# Patient Record
Sex: Male | Born: 1945 | Race: White | Hispanic: No | Marital: Married | Smoking: Former smoker
Health system: Southern US, Community
[De-identification: ages and names within clinical notes are randomized; demographics above are authoritative.]

## PROBLEM LIST (undated history)

## (undated) DIAGNOSIS — M1990 Unspecified osteoarthritis, unspecified site: Secondary | ICD-10-CM

## (undated) DIAGNOSIS — I10 Essential (primary) hypertension: Secondary | ICD-10-CM

## (undated) DIAGNOSIS — R011 Cardiac murmur, unspecified: Secondary | ICD-10-CM

## (undated) DIAGNOSIS — I5020 Unspecified systolic (congestive) heart failure: Secondary | ICD-10-CM

## (undated) DIAGNOSIS — Z87442 Personal history of urinary calculi: Secondary | ICD-10-CM

## (undated) HISTORY — PX: ANKLE FRACTURE SURGERY: SHX122

## (undated) HISTORY — PX: TONSILLECTOMY: SUR1361

## (undated) HISTORY — PX: KIDNEY SURGERY: SHX687

---

## 2017-02-07 ENCOUNTER — Encounter (HOSPITAL_COMMUNITY): Payer: Self-pay | Admitting: *Deleted

## 2017-02-07 ENCOUNTER — Emergency Department (HOSPITAL_COMMUNITY)
Admission: EM | Admit: 2017-02-07 | Discharge: 2017-02-07 | Disposition: A | Discharge status: Home / Self Care | Payer: Medicare Other | Attending: Emergency Medicine | Admitting: Emergency Medicine

## 2017-02-07 ENCOUNTER — Other Ambulatory Visit: Payer: Self-pay

## 2017-02-07 ENCOUNTER — Emergency Department (HOSPITAL_COMMUNITY): Payer: Medicare Other

## 2017-02-07 DIAGNOSIS — S82854A Nondisplaced trimalleolar fracture of right lower leg, initial encounter for closed fracture: Secondary | ICD-10-CM | POA: Insufficient documentation

## 2017-02-07 DIAGNOSIS — S99911A Unspecified injury of right ankle, initial encounter: Secondary | ICD-10-CM | POA: Diagnosis present

## 2017-02-07 DIAGNOSIS — Y93K1 Activity, walking an animal: Secondary | ICD-10-CM | POA: Insufficient documentation

## 2017-02-07 DIAGNOSIS — Y92009 Unspecified place in unspecified non-institutional (private) residence as the place of occurrence of the external cause: Secondary | ICD-10-CM | POA: Insufficient documentation

## 2017-02-07 DIAGNOSIS — S82851A Displaced trimalleolar fracture of right lower leg, initial encounter for closed fracture: Secondary | ICD-10-CM

## 2017-02-07 DIAGNOSIS — Y999 Unspecified external cause status: Secondary | ICD-10-CM | POA: Diagnosis not present

## 2017-02-07 DIAGNOSIS — W010XXA Fall on same level from slipping, tripping and stumbling without subsequent striking against object, initial encounter: Secondary | ICD-10-CM | POA: Diagnosis not present

## 2017-02-07 MED ORDER — OXYCODONE-ACETAMINOPHEN 5-325 MG PO TABS: 1.0000 | ORAL_TABLET | Freq: Four times a day (QID) | ORAL | 0 refills | Status: DC | PRN

## 2017-02-07 NOTE — Progress Notes (Signed)
Orthopedic Tech Progress Note Patient Details:  Marco Alvarado 06-09-1945 [id number]  Ortho Devices Type of Ortho Device: Post (short leg) splint, Stirrup splint Ortho Device/Splint Location: rle Ortho Device/Splint Interventions: Ordered, Application, Adjustment   Karolee Stamps 02/07/2017, 9:55 PM

## 2017-02-07 NOTE — ED Notes (Signed)
Pt refused crutches.

## 2017-02-07 NOTE — ED Provider Notes (Signed)
[facility] EMERGENCY DEPARTMENT Provider Note   CSN: [id number] Arrival date & time: 02/07/17  1747     History   Chief Complaint Chief Complaint  Patient presents with  . Ankle Pain    HPI Marco Alvarado is a 71 y.o. male who presents to the emergency department with a chief complaint of right ankle pain.  The patient reports that he was out walking his dog this afternoon, when the dog began forcefully chasing a rabbit causing the patient to fall forward.  He states that he heard a crack in his right ankle during the fall.  He reports that he was unable to ambulate or put weight on the right foot after the fall and had to crawl back to his house.  No left foot or ankle pain.  No right knee or hip pain.  No numbness or weakness.  No treatment prior to arrival.  The history is provided by the patient. No language interpreter was used.  Ankle Pain   The incident occurred 1 to 2 hours ago. The incident occurred at home. The injury mechanism was a fall. The pain is present in the right ankle. The quality of the pain is described as sharp. The pain is severe. The pain has been constant since onset. Associated symptoms include inability to bear weight. Pertinent negatives include no numbness, no muscle weakness, no loss of sensation and no tingling. He reports no foreign bodies present. The symptoms are aggravated by bearing weight. He has tried rest for the symptoms. The treatment provided mild relief.    History reviewed. No pertinent past medical history.  There are no active problems to display for this patient.   History reviewed. No pertinent surgical history.     Home Medications    Prior to Admission medications   Medication Sig Start Date End Date Taking? Authorizing Provider  oxyCODONE-acetaminophen (PERCOCET/ROXICET) 5-325 MG tablet Take 1 tablet by mouth every 6 (six) hours as needed for severe pain. 02/07/17   ,  A, PA-C    Family  History No family history on file.  Social History Social History   Tobacco Use  . Smoking status: Never Smoker  . Smokeless tobacco: Never Used  Substance Use Topics  . Alcohol use: No    Frequency: Never  . Drug use: Not on file     Allergies   Patient has no known allergies.   Review of Systems Review of Systems  Musculoskeletal: Positive for arthralgias, gait problem, joint swelling and myalgias. Negative for back pain and neck pain.  Skin: Negative for wound.  Neurological: Negative for tingling and numbness.     Physical Exam Updated Vital Signs BP (!) 157/99 (BP Location: Left Arm)   Pulse 99   Temp 97.9 F (36.6 C) (Oral)   Resp 18   Ht 5\' 7"  (1.702 m)   Wt 56.7 kg (125 lb)   SpO2 100%   BMI 19.58 kg/m   Physical Exam  Constitutional: He appears well-developed.  HENT:  Head: Normocephalic.  Eyes: Conjunctivae are normal.  Neck: Neck supple.  Cardiovascular: Normal rate and regular rhythm.  No murmur heard. Pulmonary/Chest: Effort normal.  Abdominal: Soft. He exhibits no distension.  Musculoskeletal: He exhibits edema and tenderness.  Decreased range of motion with flexion and extension of the right ankle secondary to pain.  Diffusely tender to palpation over the medial and lateral aspect of the right ankle.  Diffuse edema is noted throughout the ankle and onto the  dorsum of the right foot.  Able to move all digits of the right foot without difficulty.  DP and PT pulses are 2+.  Sensation is intact throughout the bilateral lower extremities.  Neurological: He is alert.  Skin: Skin is warm and dry.  Psychiatric: His behavior is normal.  Nursing note and vitals reviewed.    ED Treatments / Results  Labs (all labs ordered are listed, but only abnormal results are displayed) Labs Reviewed - No data to display  EKG  EKG Interpretation None       Radiology Dg Ankle Complete Right  Result Date: 02/07/2017 CLINICAL DATA:  Fall while walking  dog today. Right ankle pain and inability to bear weight. Initial encounter. EXAM: RIGHT ANKLE - COMPLETE 3+ VIEW COMPARISON:  None. FINDINGS: Minimally displaced trimalleolar ankle fracture is seen. The talus remains centered within the ankle mortise. Diffuse soft tissue swelling noted. IMPRESSION: Trimalleolar ankle fracture, without dislocation. Electronically Signed   By: Earle Gell M.D.   On: 02/07/2017 18:29    Procedures Procedures (including critical care time)  Medications Ordered in ED Medications - No data to display   Initial Impression / Assessment and Plan / ED Course  I have reviewed the triage vital signs and the nursing notes.  Pertinent labs & imaging results that were available during my care of the patient were reviewed by me and considered in my medical decision making (see chart for details).     Presenting with right ankle pain and inability to bear weight after a fall earlier today while walking his dog.  Right ankle x-ray demonstrates a trimalleolar ankle fracture that is minimally displaced.  On my read, the ankle mortise space appears well maintained.  On physical exam, the patient is neurovascularly intact. Discussed and evaluated the patient with Dr. Lita Mains, attending physician. Spoke with Dr. Alvan Dame with orthpaedic surgery who agrees with the plan to discharge the patient to home with crutches, posterior leg splint with stirrups, RICE therapy, non-weight bearing, and follow-up in the clinic in 4 days with either Dr. Doran Durand or Dr. Stann Mainland.  On reexamination after splint placement, the patient remains neurovascularly intact. Discussed this plan with the patient and his family who are in agreement at this time.  We will send the patient home with a short course of pain medication. A 46-month prescription history query was performed using the [place] CSRS prior to discharge. Strict return precautions are given.  No acute distress.  Patient is safe for discharge at this  time.  Final Clinical Impressions(s) / ED Diagnoses   Final diagnoses:  Closed trimalleolar fracture of right ankle, initial encounter    ED Discharge Orders        Ordered    oxyCODONE-acetaminophen (PERCOCET/ROXICET) 5-325 MG tablet  Every 6 hours PRN     02/07/17 2056       Joline Maxcy A, PA-C 02/07/17 2126    Julianne Rice, MD 02/17/17 [phone]

## 2017-02-07 NOTE — Discharge Instructions (Signed)
Please use the crutches and did not put any weight on your right foot or leg until you are seen by [facility] orthopedics.  Please call Monday morning to schedule a follow-up appointment for Wed., Dec. 5 with either Dr. Doran Durand or Dr. Stann Mainland.   When you are at home, please elevate the leg above the level of the heart to help with swelling.  Please cover the splint with a plastic bag when you are bathing or showering.  It is important to keep the splint clean and dry.  Do not remove the splint until you are seen by the orthopedist.  Try not to be up walking around any more than needed over the next few days.  For severe pain, you can take 1 tablet of Percocet every 6 hours.  Please note, this medication is a narcotic and can cause you to be impaired if you have to drive or work and can also be addicting.  For mild or moderate pain, please take 600 mg of ibuprofen with food or 650 mg of Tylenol every 6 hours as needed.   If you develop new or worsening symptoms, including numbness or weakness in the right foot or ankle, have a new fall or injury, or other new concerning symptoms, please return to the emergency department for reevaluation.

## 2017-02-07 NOTE — ED Triage Notes (Signed)
The pt was out walking his dog today and the dog ran after a squirrel causing the pt to fall and he heard a crack in his ankle.  Painful when ambulating

## 2021-07-10 ENCOUNTER — Ambulatory Visit
Admission: RE | Admit: 2021-07-10 | Discharge: 2021-07-10 | Disposition: A | Discharge status: Home / Self Care | Payer: Medicare Other | Source: Ambulatory Visit | Attending: Chiropractic Medicine | Admitting: Chiropractic Medicine

## 2021-07-10 ENCOUNTER — Other Ambulatory Visit: Payer: Self-pay | Admitting: Chiropractic Medicine

## 2021-07-10 DIAGNOSIS — M5442 Lumbago with sciatica, left side: Secondary | ICD-10-CM

## 2022-03-05 ENCOUNTER — Other Ambulatory Visit: Payer: Self-pay | Admitting: Urology

## 2022-03-05 DIAGNOSIS — D4102 Neoplasm of uncertain behavior of left kidney: Secondary | ICD-10-CM

## 2022-03-28 ENCOUNTER — Ambulatory Visit
Admission: RE | Admit: 2022-03-28 | Discharge: 2022-03-28 | Disposition: A | Discharge status: Home / Self Care | Payer: Medicare Other | Source: Ambulatory Visit | Attending: Urology | Admitting: Urology

## 2022-03-28 DIAGNOSIS — D4102 Neoplasm of uncertain behavior of left kidney: Secondary | ICD-10-CM

## 2022-03-28 MED ORDER — GADOPICLENOL 0.5 MMOL/ML IV SOLN: 6.0000 mL | Freq: Once | INTRAVENOUS | Status: DC | PRN

## 2022-03-28 MED ORDER — GADOPICLENOL 0.5 MMOL/ML IV SOLN
6.0000 mL | Freq: Once | INTRAVENOUS | Status: AC | PRN
  Administered 2022-03-28: 6 mL via INTRAVENOUS

## 2022-08-05 ENCOUNTER — Other Ambulatory Visit: Payer: Self-pay | Admitting: Neurological Surgery

## 2022-08-07 NOTE — Pre-Procedure Instructions (Signed)
Surgical Instructions    Your procedure is scheduled on Friday, June 7.  Report to [facility] Main Entrance "A" at 5:30 A.M., then check in with the Admitting office.  Call this number if you have problems the morning of surgery:  [phone]   If you have any questions prior to your surgery date call [phone]: Open Monday-Friday 8am-4pm If you experience any cold or flu symptoms such as cough, fever, chills, shortness of breath, etc. between now and your scheduled surgery, please notify us at the above number     Remember:  Do not eat or drink after midnight the night before your surgery   Take these medicines the morning of surgery with A SIP OF WATER:  oxyCODONE-acetaminophen (PERCOCET/ROXICET if needed  As of today, STOP taking any Aspirin (unless otherwise instructed by your surgeon) Aleve, Naproxen, Ibuprofen, Motrin, Advil, Goody's, BC's, all herbal medications, fish oil, and all vitamins.             [facility] is not responsible for any belongings or valuables.    Do NOT Smoke (Tobacco/Vaping)  24 hours prior to your procedure  If you use a CPAP at night, you may bring your mask for your overnight stay.   Contacts, glasses, hearing aids, dentures or partials may not be worn into surgery, please bring cases for these belongings   For patients admitted to the hospital, discharge time will be determined by your treatment team.   Patients discharged the day of surgery will not be allowed to drive home, and someone needs to stay with them for 24 hours.   SURGICAL WAITING ROOM VISITATION Patients having surgery or a procedure may have no more than 2 support people in the waiting area - these visitors may rotate.   Children under the age of 61 must have an adult with them who is not the patient. If the patient needs to stay at the hospital during part of their recovery, the visitor guidelines for inpatient rooms apply. Pre-op nurse will coordinate an appropriate time  for 1 support person to accompany patient in pre-op.  This support person may not rotate.   Please refer to [email or web address] for the visitor guidelines for Inpatients (after your surgery is over and you are in a regular room).    Special instructions:    Oral Hygiene is also important to reduce your risk of infection.  Remember - BRUSH YOUR TEETH THE MORNING OF SURGERY WITH YOUR REGULAR TOOTHPASTE    Pre-operative 5 CHG Bath Instructions   You can play a key role in reducing the risk of infection after surgery. Your skin needs to be as free of germs as possible. You can reduce the number of germs on your skin by washing with CHG (chlorhexidine gluconate) soap before surgery. CHG is an antiseptic soap that kills germs and continues to kill germs even after washing.   DO NOT use if you have an allergy to chlorhexidine/CHG or antibacterial soaps. If your skin becomes reddened or irritated, stop using the CHG and notify one of our RNs at [phone].   Please shower with the CHG soap starting 4 days before surgery using the following schedule:     Please keep in mind the following:  DO NOT shave, including legs and underarms, starting the day of your first shower.   You may shave your face at any point before/day of surgery.  Place clean sheets on your bed the day you start using CHG soap. Use a clean  washcloth (not used since being washed) for each shower. DO NOT sleep with pets once you start using the CHG.   CHG Shower Instructions:  If you choose to wash your hair and private area, wash first with your normal shampoo/soap.  After you use shampoo/soap, rinse your hair and body thoroughly to remove shampoo/soap residue.  Turn the water OFF and apply about 3 tablespoons (45 ml) of CHG soap to a CLEAN washcloth.  Apply CHG soap ONLY FROM YOUR NECK DOWN TO YOUR TOES (washing for 3-5 minutes)  DO NOT use CHG soap on face, private areas,  open wounds, or sores.  Pay special attention to the area where your surgery is being performed.  If you are having back surgery, having someone wash your back for you may be helpful. Wait 2 minutes after CHG soap is applied, then you may rinse off the CHG soap.  Pat dry with a clean towel  Put on clean clothes/pajamas   If you choose to wear lotion, please use ONLY the CHG-compatible lotions on the back of this paper.     Additional instructions for the day of surgery: DO NOT APPLY any lotions, deodorants, cologne, or perfumes.   Put on clean/comfortable clothes.  Brush your teeth.  Ask your nurse before applying any prescription medications to the skin.      CHG Compatible Lotions   Aveeno Moisturizing lotion  Cetaphil Moisturizing Cream  Cetaphil Moisturizing Lotion  Clairol Herbal Essence Moisturizing Lotion, Dry Skin  Clairol Herbal Essence Moisturizing Lotion, Extra Dry Skin  Clairol Herbal Essence Moisturizing Lotion, Normal Skin  Curel Age Defying Therapeutic Moisturizing Lotion with Alpha Hydroxy  Curel Extreme Care Body Lotion  Curel Soothing Hands Moisturizing Hand Lotion  Curel Therapeutic Moisturizing Cream, Fragrance-Free  Curel Therapeutic Moisturizing Lotion, Fragrance-Free  Curel Therapeutic Moisturizing Lotion, Original Formula  Eucerin Daily Replenishing Lotion  Eucerin Dry Skin Therapy Plus Alpha Hydroxy Crme  Eucerin Dry Skin Therapy Plus Alpha Hydroxy Lotion  Eucerin Original Crme  Eucerin Original Lotion  Eucerin Plus Crme Eucerin Plus Lotion  Eucerin TriLipid Replenishing Lotion  Keri Anti-Bacterial Hand Lotion  Keri Deep Conditioning Original Lotion Dry Skin Formula Softly Scented  Keri Deep Conditioning Original Lotion, Fragrance Free Sensitive Skin Formula  Keri Lotion Fast Absorbing Fragrance Free Sensitive Skin Formula  Keri Lotion Fast Absorbing Softly Scented Dry Skin Formula  Keri Original Lotion  Keri Skin Renewal Lotion Keri Silky  Smooth Lotion  Keri Silky Smooth Sensitive Skin Lotion  Nivea Body Creamy Conditioning Oil  Nivea Body Extra Enriched Lotion  Nivea Body Original Lotion  Nivea Body Sheer Moisturizing Lotion Nivea Crme  Nivea Skin Firming Lotion  NutraDerm 30 Skin Lotion  NutraDerm Skin Lotion  NutraDerm Therapeutic Skin Cream  NutraDerm Therapeutic Skin Lotion  ProShield Protective Hand Cream  Provon moisturizing lotion  Day of Surgery:  Take a shower with CHG soap. Wear Clean/Comfortable clothing the morning of surgery Do not wear jewelry or makeup. Do not wear lotions, powders, perfumes/cologne or deodorant.  Men may shave face and neck. Do not bring valuables to the hospital. Do not wear nail polish, gel polish, artificial nails, or any other type of covering on natural nails (fingers and toes) If you have artificial nails or gel coating that need to be removed by a nail salon, please have this removed prior to surgery. Artificial nails or gel coating may interfere with anesthesia's ability to adequately monitor your vital signs. Remember to brush your teeth WITH YOUR REGULAR TOOTHPASTE.  If you received a COVID test during your pre-op visit, it is requested that you wear a mask when out in public, stay away from anyone that may not be feeling well, and notify your surgeon if you develop symptoms. If you have been in contact with anyone that has tested positive in the last 10 days, please notify your surgeon.    Please read over the following fact sheets that you were given.

## 2022-08-08 ENCOUNTER — Other Ambulatory Visit: Payer: Self-pay

## 2022-08-08 ENCOUNTER — Encounter (HOSPITAL_COMMUNITY): Payer: Self-pay

## 2022-08-08 ENCOUNTER — Encounter (HOSPITAL_COMMUNITY)
Admission: RE | Admit: 2022-08-08 | Discharge: 2022-08-08 | Disposition: A | Discharge status: Home / Self Care | Payer: Medicare Other | Source: Ambulatory Visit | Attending: Neurological Surgery | Admitting: Neurological Surgery

## 2022-08-08 VITALS — BP 175/107 | HR 92 | Temp 97.6°F | Resp 17 | Ht 67.0 in | Wt 130.0 lb

## 2022-08-08 DIAGNOSIS — I10 Essential (primary) hypertension: Secondary | ICD-10-CM | POA: Diagnosis not present

## 2022-08-08 DIAGNOSIS — Z01818 Encounter for other preprocedural examination: Secondary | ICD-10-CM | POA: Insufficient documentation

## 2022-08-08 DIAGNOSIS — I2510 Atherosclerotic heart disease of native coronary artery without angina pectoris: Secondary | ICD-10-CM | POA: Diagnosis not present

## 2022-08-08 HISTORY — DX: Unspecified systolic (congestive) heart failure: I50.20

## 2022-08-08 HISTORY — DX: Essential (primary) hypertension: I10

## 2022-08-08 HISTORY — DX: Personal history of urinary calculi: Z87.442

## 2022-08-08 LAB — TYPE AND SCREEN
ABO/RH(D): O POS
Antibody Screen: NEGATIVE

## 2022-08-08 LAB — BASIC METABOLIC PANEL
Anion gap: 10 (ref 5–15)
BUN: 20 mg/dL (ref 8–23)
CO2: 24 mmol/L (ref 22–32)
Calcium: 9.2 mg/dL (ref 8.9–10.3)
Chloride: 99 mmol/L (ref 98–111)
Creatinine, Ser: 1.12 mg/dL (ref 0.61–1.24)
GFR, Estimated: >60 mL/min (ref >60)
Glucose, Bld: 103 mg/dL — ABNORMAL HIGH (ref 70–99)
Potassium: 3.5 mmol/L (ref 3.5–5.1)
Sodium: 133 mmol/L — ABNORMAL LOW (ref 135–145)

## 2022-08-08 LAB — CBC
HCT: 50.4 % (ref 39.0–52.0)
Hemoglobin: 16.8 g/dL (ref 13.0–17.0)
MCH: 30.3 pg (ref 26.0–34.0)
MCHC: 33.3 g/dL (ref 30.0–36.0)
MCV: 91 fL (ref 80.0–100.0)
Platelets: 262 10*3/uL (ref 150–400)
RBC: 5.54 MIL/uL (ref 4.22–5.81)
RDW: 13.2 % (ref 11.5–15.5)
WBC: 8.4 10*3/uL (ref 4.0–10.5)
nRBC: 0 % (ref 0.0–0.2)

## 2022-08-08 LAB — PROTIME-INR
INR: 0.9 (ref 0.8–1.2)
Prothrombin Time: 12.8 seconds (ref 11.4–15.2)

## 2022-08-08 LAB — SURGICAL PCR SCREEN
MRSA, PCR: NEGATIVE
Staphylococcus aureus: NEGATIVE

## 2022-08-08 NOTE — Pre-Procedure Instructions (Addendum)
Surgical Instructions    Your procedure is scheduled on Friday, August 15, 2022 at 10:22 AM.  Report to [facility] Main Entrance "A" at 8:20 A.M., then check in with the Admitting office.  Call this number if you have problems the morning of surgery:  [phone]   If you have any questions prior to your surgery date call [phone]: Open Monday-Friday 8am-4pm If you experience any cold or flu symptoms such as cough, fever, chills, shortness of breath, etc. between now and your scheduled surgery, please notify us at the above number     Remember:  Do not eat or drink after midnight the night before your surgery   Take these medicines the morning of surgery with A SIP OF WATER:   NONE  As of today, STOP taking any Aspirin (unless otherwise instructed by your surgeon) Aleve, Naproxen, Ibuprofen, Motrin, Advil, Goody's, BC's, all herbal medications, fish oil, and all vitamins.   [place] is not responsible for any belongings or valuables.    Do NOT Smoke (Tobacco/Vaping)  24 hours prior to your procedure  If you use a CPAP at night, you may bring your mask for your overnight stay.   Contacts, glasses, hearing aids, dentures or partials may not be worn into surgery, please bring cases for these belongings   For patients admitted to the hospital, discharge time will be determined by your treatment team.   Patients discharged the day of surgery will not be allowed to drive home, and someone needs to stay with them for 24 hours.   SURGICAL WAITING ROOM VISITATION Patients having surgery or a procedure may have no more than 2 support people in the waiting area - these visitors may rotate.   Children under the age of 59 must have an adult with them who is not the patient. If the patient needs to stay at the hospital during part of their recovery, the visitor guidelines for inpatient rooms apply. Pre-op nurse will coordinate an appropriate time for 1 support person to accompany  patient in pre-op.  This support person may not rotate.   Please refer to [email or web address] for the visitor guidelines for Inpatients (after your surgery is over and you are in a regular room).    Special instructions:    Oral Hygiene is also important to reduce your risk of infection.  Remember - BRUSH YOUR TEETH THE MORNING OF SURGERY WITH YOUR REGULAR TOOTHPASTE    Pre-operative 5 CHG Bath Instructions   You can play a key role in reducing the risk of infection after surgery. Your skin needs to be as free of germs as possible. You can reduce the number of germs on your skin by washing with CHG (chlorhexidine gluconate) soap before surgery. CHG is an antiseptic soap that kills germs and continues to kill germs even after washing.   DO NOT use if you have an allergy to chlorhexidine/CHG or antibacterial soaps. If your skin becomes reddened or irritated, stop using the CHG and notify one of our RNs at [phone].   Please shower with the CHG soap starting 4 days before surgery using the following schedule:     Please keep in mind the following:  DO NOT shave, including legs and underarms, starting the day of your first shower.   You may shave your face at any point before/day of surgery.  Place clean sheets on your bed the day you start using CHG soap. Use a clean washcloth (not used since being washed) for each  shower. DO NOT sleep with pets once you start using the CHG.   CHG Shower Instructions:  If you choose to wash your hair and private area, wash first with your normal shampoo/soap.  After you use shampoo/soap, rinse your hair and body thoroughly to remove shampoo/soap residue.  Turn the water OFF and apply about 3 tablespoons (45 ml) of CHG soap to a CLEAN washcloth.  Apply CHG soap ONLY FROM YOUR NECK DOWN TO YOUR TOES (washing for 3-5 minutes)  DO NOT use CHG soap on face, private areas, open wounds, or sores.  Pay  special attention to the area where your surgery is being performed.  If you are having back surgery, having someone wash your back for you may be helpful. Wait 2 minutes after CHG soap is applied, then you may rinse off the CHG soap.  Pat dry with a clean towel  Put on clean clothes/pajamas   If you choose to wear lotion, please use ONLY the CHG-compatible lotions on the back of this paper.     Additional instructions for the day of surgery: DO NOT APPLY any lotions, deodorants, cologne, or perfumes.   Put on clean/comfortable clothes.  Brush your teeth.  Ask your nurse before applying any prescription medications to the skin.      CHG Compatible Lotions   Aveeno Moisturizing lotion  Cetaphil Moisturizing Cream  Cetaphil Moisturizing Lotion  Clairol Herbal Essence Moisturizing Lotion, Dry Skin  Clairol Herbal Essence Moisturizing Lotion, Extra Dry Skin  Clairol Herbal Essence Moisturizing Lotion, Normal Skin  Curel Age Defying Therapeutic Moisturizing Lotion with Alpha Hydroxy  Curel Extreme Care Body Lotion  Curel Soothing Hands Moisturizing Hand Lotion  Curel Therapeutic Moisturizing Cream, Fragrance-Free  Curel Therapeutic Moisturizing Lotion, Fragrance-Free  Curel Therapeutic Moisturizing Lotion, Original Formula  Eucerin Daily Replenishing Lotion  Eucerin Dry Skin Therapy Plus Alpha Hydroxy Crme  Eucerin Dry Skin Therapy Plus Alpha Hydroxy Lotion  Eucerin Original Crme  Eucerin Original Lotion  Eucerin Plus Crme Eucerin Plus Lotion  Eucerin TriLipid Replenishing Lotion  Keri Anti-Bacterial Hand Lotion  Keri Deep Conditioning Original Lotion Dry Skin Formula Softly Scented  Keri Deep Conditioning Original Lotion, Fragrance Free Sensitive Skin Formula  Keri Lotion Fast Absorbing Fragrance Free Sensitive Skin Formula  Keri Lotion Fast Absorbing Softly Scented Dry Skin Formula  Keri Original Lotion  Keri Skin Renewal Lotion Keri Silky Smooth Lotion  Keri Silky Smooth  Sensitive Skin Lotion  Nivea Body Creamy Conditioning Oil  Nivea Body Extra Enriched Lotion  Nivea Body Original Lotion  Nivea Body Sheer Moisturizing Lotion Nivea Crme  Nivea Skin Firming Lotion  NutraDerm 30 Skin Lotion  NutraDerm Skin Lotion  NutraDerm Therapeutic Skin Cream  NutraDerm Therapeutic Skin Lotion  ProShield Protective Hand Cream  Provon moisturizing lotion  Day of Surgery:  Take a shower with CHG soap. Wear Clean/Comfortable clothing the morning of surgery Do not wear jewelry or makeup. Do not wear lotions, powders, perfumes/cologne or deodorant.  Men may shave face and neck. Do not bring valuables to the hospital. Do not wear nail polish, gel polish, artificial nails, or any other type of covering on natural nails (fingers and toes) If you have artificial nails or gel coating that need to be removed by a nail salon, please have this removed prior to surgery. Artificial nails or gel coating may interfere with anesthesia's ability to adequately monitor your vital signs. Remember to brush your teeth WITH YOUR REGULAR TOOTHPASTE.    If you received a COVID  test during your pre-op visit, it is requested that you wear a mask when out in public, stay away from anyone that may not be feeling well, and notify your surgeon if you develop symptoms. If you have been in contact with anyone that has tested positive in the last 10 days, please notify your surgeon.    Please read over the following fact sheets that you were given.

## 2022-08-08 NOTE — Progress Notes (Signed)
PCP - Theadora Rama at [facility] Cardiologist - Dr. Dyane Dustman  PPM/ICD - Denies  Chest x-ray - N/A EKG - 08/08/22 Stress Test - 06/07/21 ECHO - 06/26/21 Cardiac Cath - Denies  Sleep Study - Denies  Diabetes: Denies  Blood Thinner Instructions: N/A Aspirin Instructions: N/A  ERAS Protcol - No   COVID TEST- N/A   Anesthesia review: Yes, cardiac hx  Patient denies shortness of breath, fever, cough and chest pain at PAT appointment   All instructions explained to the patient, with a verbal understanding of the material. Patient agrees to go over the instructions while at home for a better understanding. Patient also instructed to self quarantine after being tested for COVID-19. The opportunity to ask questions was provided.

## 2022-08-13 ENCOUNTER — Encounter (HOSPITAL_COMMUNITY): Payer: Self-pay

## 2022-08-13 NOTE — Progress Notes (Signed)
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  I was called to speak with the patient during his preop testing appointment due to elevated blood pressure, 175/107 on arrival and similar on recheck.  Review of his chart in Care Everywhere showed that he had been evaluated by cardiology in March 2023.  Evaluation was prompted by elevated BNP found on screening labs for an insurance policy.  Echo and stress test were ordered.  Echo showed EF 30 to 35% with mild MR.  Nuclear stress showed no ischemia.  He was last seen by Dr. Chales Abrahams in May 2023 and started on Coreg and Entresto and recommended to follow-up in 3 months for recheck of ejection fraction.  Also recommended consider definitive ischemic evaluation with cardiac catheterization and possible follow-up with EP cardiology to consider resynchronization therapy if his EF did not improve due to presence of wide QRS complex on EKG.  Patient reports he did not start either Coreg or Entresto and did not follow-up with Dr. Chales Abrahams.  It is not clear that he fully understood the extent of his cardiac issues.  He states he was feeling well and did not think he needed the medication or follow-up.  I had a long discussion with the patient about his uncontrolled hypertension, systolic heart failure, and the role of the medications in treating both of these.  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  He is in a wheelchair today.  He has had several falls at home when attempting to ambulate independently.  We discussed that given the severe nature of his pathology and risk for further neurologic decompensation, there is an obvious urgency to his surgery.  However, he is certainly at higher risk given his underlying heart failure and noncompliance with medication and follow-up. Pt verbalized understanding of this.   I advised him to  reach out to Dr. Marcille Blanco office today and request that the medications be represcribed so that he can start them ASAP.  I also advised him that I would reach out to Dr. Barnett Applebaum office to discuss his cardiac concerns as well as request they send a clearance form to Dr. Marcille Blanco office.  I advised the patient I would call next week to follow-up  I spoke with the pt's wife Artavius Ganter on 08/11/22 and she advised that the patient had picked up and started both Coreg and Entresto on 08/09/2022.  Blood pressure is improving.  Home readings averaging in the 140s systolic and 90s diastolic.  They were not able to get in to see Dr. Chales Abrahams prior to surgery. Next followup is scheduled for August.   I spoke with Dr. Yetta Barre' surgical scheduler Erie Noe on 08/13/22. She has sent clearance request to Dr. Marcille Blanco office, but has not received a response. She did call to followup and they did confirm that have received the request and are waiting for Dr. Chales Abrahams to give input.   I also spoke with Dr. Yetta Barre regarding patient's cardiac concerns.  He advised that due to severe cervical pathology and gross myelopathy the patient needs to proceed urgently with surgery.  I reviewed patient's case with anesthesiologist Dr. Armond Hang.  Difficult situation as patient had newly diagnosed systolic heart failure 1 year ago and did not follow cardiology recommendations.  However, in light of severity of the patient's cervical spine pathology, recommended proceed as planned with day of surgery evaluation by assigned anesthesiologist and  understanding the patient is at higher risk due to lack of follow-up and compliance. Would still appreciate cardiology input, which we are working on, but also with the understanding that any input may be limited as patient has not been seen in followup for over a year.   Preop labs reviewed, mild hyponatremia sodium 133, otherwise WNL.  EKG 08/08/2022: NSR.  Rate 83.  Possible LAE.  LAD.  Left ventricular  hypertrophy with QRS widening and repolarization abnormality.  Cannot rule out septal infarct, age undetermined.  TTE 06/26/2021 (Care Everywhere): Adequate 2D, M mode, and color flow doppler echo demonstrates normal LV  chamber size with mild LVH. There is moderately severe global hypokinesis  with EF estimated at 30-35%.  The AV is mildly thickened but opens adequately. No AS is present.  The MV is pliable: there is mild MR.  TV is normal; there is mild TR.  The atria are normal in size.  RV is normal.  The pericardium is normal thickness, there is no pericardial effusion.   Nuclear stress 06/07/2021: CONCLUSIONS:  1. This study is negative for coronary artery disease.  2. There is a suggestion of diaphragmatic attenuation.  3. Subjectively, LV function is normal but the ejection fraction was calculated by the [facility] software at 49%     [facility] Short Stay Center/Anesthesiology Phone [phone] 08/13/2022 4:25 PM

## 2022-08-14 NOTE — Anesthesia Preprocedure Evaluation (Addendum)
Anesthesia Evaluation  Patient identified by MRN, date of birth, ID band Patient awake    Reviewed: Allergy & Precautions, H&P , NPO status , Patient's Chart, lab work & pertinent test results  Airway Mallampati: III  TM Distance: >3 FB Neck ROM: Full    Dental no notable dental hx. (+) Teeth Intact, Dental Advisory Given   Pulmonary former smoker   Pulmonary exam normal breath sounds clear to auscultation       Cardiovascular hypertension, Pt. on medications and Pt. on home beta blockers +CHF   Rhythm:Regular Rate:Normal     Neuro/Psych negative neurological ROS  negative psych ROS   GI/Hepatic negative GI ROS, Neg liver ROS,,,  Endo/Other  negative endocrine ROS    Renal/GU negative Renal ROS  negative genitourinary   Musculoskeletal   Abdominal   Peds  Hematology negative hematology ROS (+)   Anesthesia Other Findings   Reproductive/Obstetrics negative OB ROS                             Anesthesia Physical Anesthesia Plan  ASA: 4  Anesthesia Plan: General   Post-op Pain Management: Tylenol PO (pre-op)* and Sufentanil infusion   Induction: Intravenous  PONV Risk Score and Plan: 3 and Ondansetron and Dexamethasone  Airway Management Planned: Oral ETT and Video Laryngoscope Planned  Additional Equipment: Arterial line  Intra-op Plan:   Post-operative Plan: Extubation in OR  Informed Consent: I have reviewed the patients History and Physical, chart, labs and discussed the procedure including the risks, benefits and alternatives for the proposed anesthesia with the patient or authorized representative who has indicated his/her understanding and acceptance.     Dental advisory given  Plan Discussed with: CRNA  Anesthesia Plan Comments: (See PAT note by Antionette Poles, PA-C  )        Anesthesia Quick Evaluation

## 2022-08-15 ENCOUNTER — Other Ambulatory Visit: Payer: Self-pay

## 2022-08-15 ENCOUNTER — Inpatient Hospital Stay (HOSPITAL_COMMUNITY)
Admission: RE | Admit: 2022-08-15 | Discharge: 2022-08-16 | DRG: 472 | Disposition: A | Discharge status: Home / Self Care | Payer: Medicare Other | Attending: Neurological Surgery | Admitting: Neurological Surgery

## 2022-08-15 ENCOUNTER — Inpatient Hospital Stay (HOSPITAL_COMMUNITY)
Admission: RE | Disposition: A | Discharge status: Home / Self Care | Payer: Self-pay | Source: Home / Self Care | Attending: Neurological Surgery

## 2022-08-15 ENCOUNTER — Encounter (HOSPITAL_COMMUNITY): Payer: Self-pay | Admitting: Neurological Surgery

## 2022-08-15 ENCOUNTER — Inpatient Hospital Stay (HOSPITAL_COMMUNITY): Payer: Medicare Other

## 2022-08-15 ENCOUNTER — Inpatient Hospital Stay (HOSPITAL_COMMUNITY): Payer: Medicare Other | Admitting: Physician Assistant

## 2022-08-15 ENCOUNTER — Inpatient Hospital Stay (HOSPITAL_COMMUNITY): Payer: Medicare Other | Admitting: Anesthesiology

## 2022-08-15 DIAGNOSIS — Z981 Arthrodesis status: Principal | ICD-10-CM

## 2022-08-15 DIAGNOSIS — I509 Heart failure, unspecified: Secondary | ICD-10-CM | POA: Diagnosis not present

## 2022-08-15 DIAGNOSIS — I5022 Chronic systolic (congestive) heart failure: Secondary | ICD-10-CM | POA: Diagnosis present

## 2022-08-15 DIAGNOSIS — M4712 Other spondylosis with myelopathy, cervical region: Secondary | ICD-10-CM

## 2022-08-15 DIAGNOSIS — Z87891 Personal history of nicotine dependence: Secondary | ICD-10-CM

## 2022-08-15 DIAGNOSIS — M4802 Spinal stenosis, cervical region: Secondary | ICD-10-CM

## 2022-08-15 DIAGNOSIS — M2578 Osteophyte, vertebrae: Secondary | ICD-10-CM | POA: Diagnosis present

## 2022-08-15 DIAGNOSIS — I110 Hypertensive heart disease with heart failure: Secondary | ICD-10-CM | POA: Diagnosis present

## 2022-08-15 DIAGNOSIS — R261 Paralytic gait: Secondary | ICD-10-CM | POA: Diagnosis present

## 2022-08-15 DIAGNOSIS — M50023 Cervical disc disorder at C6-C7 level with myelopathy: Secondary | ICD-10-CM | POA: Diagnosis present

## 2022-08-15 DIAGNOSIS — M50022 Cervical disc disorder at C5-C6 level with myelopathy: Secondary | ICD-10-CM | POA: Diagnosis present

## 2022-08-15 DIAGNOSIS — Z87442 Personal history of urinary calculi: Secondary | ICD-10-CM

## 2022-08-15 DIAGNOSIS — Z79899 Other long term (current) drug therapy: Secondary | ICD-10-CM | POA: Diagnosis not present

## 2022-08-15 HISTORY — PX: ANTERIOR CERVICAL DECOMP/DISCECTOMY FUSION: SHX1161

## 2022-08-15 LAB — ABO/RH: ABO/RH(D): O POS

## 2022-08-15 SURGERY — ANTERIOR CERVICAL DECOMPRESSION/DISCECTOMY FUSION 3 LEVELS
Anesthesia: General

## 2022-08-15 MED ORDER — DEXAMETHASONE SODIUM PHOSPHATE 10 MG/ML IJ SOLN
INTRAMUSCULAR | Status: DC | PRN
  Administered 2022-08-15: 10 mg via INTRAVENOUS

## 2022-08-15 MED ORDER — ONDANSETRON HCL 4 MG PO TABS: 4.0000 mg | ORAL_TABLET | Freq: Four times a day (QID) | ORAL | Status: DC | PRN

## 2022-08-15 MED ORDER — ONDANSETRON HCL 4 MG/2ML IJ SOLN
INTRAMUSCULAR | Status: DC | PRN
  Administered 2022-08-15: 4 mg via INTRAVENOUS

## 2022-08-15 MED ORDER — BUPIVACAINE HCL (PF) 0.25 % IJ SOLN
INTRAMUSCULAR | Status: AC
  Filled 2022-08-15: qty 30

## 2022-08-15 MED ORDER — SACUBITRIL-VALSARTAN 24-26 MG PO TABS
1.0000 | ORAL_TABLET | Freq: Two times a day (BID) | ORAL | Status: DC
  Administered 2022-08-15 – 2022-08-16 (×2): 1 via ORAL
  Filled 2022-08-15 (×2): qty 1

## 2022-08-15 MED ORDER — ROCURONIUM BROMIDE 10 MG/ML (PF) SYRINGE
PREFILLED_SYRINGE | INTRAVENOUS | Status: AC
  Filled 2022-08-15: qty 10

## 2022-08-15 MED ORDER — PHENYLEPHRINE 80 MCG/ML (10ML) SYRINGE FOR IV PUSH (FOR BLOOD PRESSURE SUPPORT)
PREFILLED_SYRINGE | INTRAVENOUS | Status: DC | PRN
  Administered 2022-08-15: 80 ug via INTRAVENOUS

## 2022-08-15 MED ORDER — ACETAMINOPHEN 325 MG PO TABS: 650.0000 mg | ORAL_TABLET | ORAL | Status: DC | PRN

## 2022-08-15 MED ORDER — ORAL CARE MOUTH RINSE: 15.0000 mL | Freq: Once | OROMUCOSAL | Status: AC

## 2022-08-15 MED ORDER — SUCCINYLCHOLINE CHLORIDE 200 MG/10ML IV SOSY
PREFILLED_SYRINGE | INTRAVENOUS | Status: DC | PRN
  Administered 2022-08-15: 120 mg via INTRAVENOUS

## 2022-08-15 MED ORDER — THROMBIN 5000 UNITS EX SOLR
CUTANEOUS | Status: AC
  Filled 2022-08-15: qty 5000

## 2022-08-15 MED ORDER — LIDOCAINE 2% (20 MG/ML) 5 ML SYRINGE
INTRAMUSCULAR | Status: AC
  Filled 2022-08-15: qty 5

## 2022-08-15 MED ORDER — PROPOFOL 10 MG/ML IV BOLUS
INTRAVENOUS | Status: DC | PRN
  Administered 2022-08-15: 20 mg via INTRAVENOUS
  Administered 2022-08-15: 100 mg via INTRAVENOUS

## 2022-08-15 MED ORDER — CARVEDILOL 3.125 MG PO TABS
3.1250 mg | ORAL_TABLET | Freq: Two times a day (BID) | ORAL | Status: DC
  Administered 2022-08-16: 3.125 mg via ORAL
  Filled 2022-08-15 (×2): qty 1

## 2022-08-15 MED ORDER — CHLORHEXIDINE GLUCONATE 0.12 % MT SOLN: 15.0000 mL | Freq: Once | OROMUCOSAL | Status: AC

## 2022-08-15 MED ORDER — CEFAZOLIN SODIUM-DEXTROSE 2-4 GM/100ML-% IV SOLN
INTRAVENOUS | Status: AC
  Filled 2022-08-15: qty 100

## 2022-08-15 MED ORDER — BUPIVACAINE HCL (PF) 0.25 % IJ SOLN
INTRAMUSCULAR | Status: DC | PRN
  Administered 2022-08-15: 6 mL

## 2022-08-15 MED ORDER — SUFENTANIL CITRATE 50 MCG/ML IV SOLN
0.5000 ug/kg/h | INTRAVENOUS | Status: AC
  Administered 2022-08-15: .15 ug/kg/h via INTRAVENOUS
  Filled 2022-08-15: qty 1

## 2022-08-15 MED ORDER — SODIUM CHLORIDE 0.9 % IV SOLN: 250.0000 mL | INTRAVENOUS | Status: DC

## 2022-08-15 MED ORDER — MENTHOL 3 MG MT LOZG: 1.0000 | LOZENGE | OROMUCOSAL | Status: DC | PRN

## 2022-08-15 MED ORDER — CEFAZOLIN SODIUM-DEXTROSE 2-4 GM/100ML-% IV SOLN
2.0000 g | Freq: Three times a day (TID) | INTRAVENOUS | Status: AC
  Administered 2022-08-15 – 2022-08-16 (×2): 2 g via INTRAVENOUS
  Filled 2022-08-15 (×2): qty 100

## 2022-08-15 MED ORDER — ACETAMINOPHEN 500 MG PO TABS: 1000.0000 mg | ORAL_TABLET | ORAL | Status: AC

## 2022-08-15 MED ORDER — PHENOL 1.4 % MT LIQD: 1.0000 | OROMUCOSAL | Status: DC | PRN

## 2022-08-15 MED ORDER — DEXAMETHASONE 4 MG PO TABS
4.0000 mg | ORAL_TABLET | Freq: Four times a day (QID) | ORAL | Status: DC
  Administered 2022-08-16: 4 mg via ORAL
  Filled 2022-08-15 (×7): qty 1

## 2022-08-15 MED ORDER — 0.9 % SODIUM CHLORIDE (POUR BTL) OPTIME
TOPICAL | Status: DC | PRN
  Administered 2022-08-15: 1000 mL

## 2022-08-15 MED ORDER — SODIUM CHLORIDE 0.9% FLUSH
3.0000 mL | INTRAVENOUS | Status: DC | PRN
  Administered 2022-08-15: 3 mL via INTRAVENOUS

## 2022-08-15 MED ORDER — HYDROMORPHONE HCL 1 MG/ML IJ SOLN: 0.2500 mg | INTRAMUSCULAR | Status: DC | PRN

## 2022-08-15 MED ORDER — CARVEDILOL 3.125 MG PO TABS
ORAL_TABLET | ORAL | Status: AC
  Administered 2022-08-15: 3.125 mg
  Filled 2022-08-15: qty 1

## 2022-08-15 MED ORDER — SUGAMMADEX SODIUM 200 MG/2ML IV SOLN
INTRAVENOUS | Status: DC | PRN
  Administered 2022-08-15: 200 mg via INTRAVENOUS

## 2022-08-15 MED ORDER — CHLORHEXIDINE GLUCONATE CLOTH 2 % EX PADS: 6.0000 | MEDICATED_PAD | Freq: Once | CUTANEOUS | Status: DC

## 2022-08-15 MED ORDER — SODIUM CHLORIDE 0.9% FLUSH
3.0000 mL | Freq: Two times a day (BID) | INTRAVENOUS | Status: DC
  Administered 2022-08-16: 3 mL via INTRAVENOUS

## 2022-08-15 MED ORDER — DEXAMETHASONE SODIUM PHOSPHATE 4 MG/ML IJ SOLN
4.0000 mg | Freq: Four times a day (QID) | INTRAMUSCULAR | Status: DC
  Administered 2022-08-15 – 2022-08-16 (×3): 4 mg via INTRAVENOUS
  Filled 2022-08-15 (×3): qty 1

## 2022-08-15 MED ORDER — POTASSIUM CHLORIDE IN NACL 20-0.9 MEQ/L-% IV SOLN
INTRAVENOUS | Status: DC
  Filled 2022-08-15: qty 1000

## 2022-08-15 MED ORDER — FENTANYL CITRATE (PF) 250 MCG/5ML IJ SOLN
INTRAMUSCULAR | Status: DC | PRN
  Administered 2022-08-15: 25 ug via INTRAVENOUS
  Administered 2022-08-15: 50 ug via INTRAVENOUS

## 2022-08-15 MED ORDER — CHLORHEXIDINE GLUCONATE 0.12 % MT SOLN
OROMUCOSAL | Status: AC
  Administered 2022-08-15: 15 mL via OROMUCOSAL
  Filled 2022-08-15: qty 15

## 2022-08-15 MED ORDER — GABAPENTIN 300 MG PO CAPS
ORAL_CAPSULE | ORAL | Status: AC
  Administered 2022-08-15: 300 mg via ORAL
  Filled 2022-08-15: qty 1

## 2022-08-15 MED ORDER — ONDANSETRON HCL 4 MG/2ML IJ SOLN
INTRAMUSCULAR | Status: AC
  Filled 2022-08-15: qty 2

## 2022-08-15 MED ORDER — ONDANSETRON HCL 4 MG/2ML IJ SOLN: 4.0000 mg | Freq: Four times a day (QID) | INTRAMUSCULAR | Status: DC | PRN

## 2022-08-15 MED ORDER — GABAPENTIN 300 MG PO CAPS: 300.0000 mg | ORAL_CAPSULE | ORAL | Status: AC

## 2022-08-15 MED ORDER — LIDOCAINE 2% (20 MG/ML) 5 ML SYRINGE
INTRAMUSCULAR | Status: DC | PRN
  Administered 2022-08-15: 40 mg via INTRAVENOUS

## 2022-08-15 MED ORDER — CEFAZOLIN SODIUM-DEXTROSE 2-4 GM/100ML-% IV SOLN
2.0000 g | INTRAVENOUS | Status: AC
  Administered 2022-08-15: 2 g via INTRAVENOUS

## 2022-08-15 MED ORDER — HYDROCODONE-ACETAMINOPHEN 7.5-325 MG PO TABS
1.0000 | ORAL_TABLET | ORAL | Status: DC | PRN
  Administered 2022-08-15 – 2022-08-16 (×3): 1 via ORAL
  Filled 2022-08-15 (×3): qty 1

## 2022-08-15 MED ORDER — FENTANYL CITRATE (PF) 250 MCG/5ML IJ SOLN
INTRAMUSCULAR | Status: AC
  Filled 2022-08-15: qty 5

## 2022-08-15 MED ORDER — METHOCARBAMOL 1000 MG/10ML IJ SOLN: 500.0000 mg | Freq: Four times a day (QID) | INTRAVENOUS | Status: DC | PRN

## 2022-08-15 MED ORDER — METHOCARBAMOL 500 MG PO TABS
500.0000 mg | ORAL_TABLET | Freq: Four times a day (QID) | ORAL | Status: DC | PRN
  Administered 2022-08-16: 500 mg via ORAL
  Filled 2022-08-15: qty 1

## 2022-08-15 MED ORDER — ACETAMINOPHEN 650 MG RE SUPP: 650.0000 mg | RECTAL | Status: DC | PRN

## 2022-08-15 MED ORDER — SENNA 8.6 MG PO TABS
1.0000 | ORAL_TABLET | Freq: Two times a day (BID) | ORAL | Status: DC
  Administered 2022-08-15 – 2022-08-16 (×2): 8.6 mg via ORAL
  Filled 2022-08-15 (×2): qty 1

## 2022-08-15 MED ORDER — THROMBIN 5000 UNITS EX SOLR
OROMUCOSAL | Status: DC | PRN
  Administered 2022-08-15 (×2): 5 mL via TOPICAL

## 2022-08-15 MED ORDER — LACTATED RINGERS IV SOLN: INTRAVENOUS | Status: DC

## 2022-08-15 MED ORDER — MORPHINE SULFATE (PF) 2 MG/ML IV SOLN: 2.0000 mg | INTRAVENOUS | Status: DC | PRN

## 2022-08-15 MED ORDER — ACETAMINOPHEN 500 MG PO TABS
ORAL_TABLET | ORAL | Status: AC
  Administered 2022-08-15: 1000 mg via ORAL
  Filled 2022-08-15: qty 2

## 2022-08-15 MED ORDER — PHENYLEPHRINE HCL-NACL 20-0.9 MG/250ML-% IV SOLN
INTRAVENOUS | Status: DC | PRN
  Administered 2022-08-15: 50 ug/min via INTRAVENOUS

## 2022-08-15 MED ORDER — ROCURONIUM BROMIDE 10 MG/ML (PF) SYRINGE
PREFILLED_SYRINGE | INTRAVENOUS | Status: DC | PRN
  Administered 2022-08-15: 10 mg via INTRAVENOUS
  Administered 2022-08-15: 70 mg via INTRAVENOUS

## 2022-08-15 MED ORDER — DEXAMETHASONE SODIUM PHOSPHATE 10 MG/ML IJ SOLN
INTRAMUSCULAR | Status: AC
  Filled 2022-08-15: qty 1

## 2022-08-15 SURGICAL SUPPLY — 52 items
ADH SKN CLS APL DERMABOND .7 (GAUZE/BANDAGES/DRESSINGS) ×1
APL SKNCLS STERI-STRIP NONHPOA (GAUZE/BANDAGES/DRESSINGS) ×1
BAG COUNTER SPONGE SURGICOUNT (BAG) ×1 IMPLANT
BAG SPNG CNTER NS LX DISP (BAG) ×1
BASKET BONE COLLECTION (BASKET) IMPLANT
BENZOIN TINCTURE PRP APPL 2/3 (GAUZE/BANDAGES/DRESSINGS) ×1 IMPLANT
BUR CARBIDE MATCH 3.0 (BURR) ×1 IMPLANT
CANISTER SUCT 3000ML PPV (MISCELLANEOUS) ×1 IMPLANT
DERMABOND ADVANCED .7 DNX12 (GAUZE/BANDAGES/DRESSINGS) IMPLANT
DRAPE C-ARM 42X72 X-RAY (DRAPES) ×2 IMPLANT
DRAPE LAPAROTOMY 100X72 PEDS (DRAPES) ×1 IMPLANT
DRAPE MICROSCOPE SLANT 54X150 (MISCELLANEOUS) ×1 IMPLANT
DRSG OPSITE POSTOP 4X6 (GAUZE/BANDAGES/DRESSINGS) IMPLANT
DURAPREP 6ML APPLICATOR 50/CS (WOUND CARE) ×1 IMPLANT
ELECT COATED BLADE 2.86 ST (ELECTRODE) ×1 IMPLANT
ELECT REM PT RETURN 9FT ADLT (ELECTROSURGICAL) ×1
ELECTRODE REM PT RTRN 9FT ADLT (ELECTROSURGICAL) ×1 IMPLANT
GAUZE 4X4 16PLY ~~LOC~~+RFID DBL (SPONGE) IMPLANT
GLOVE BIO SURGEON STRL SZ7 (GLOVE) IMPLANT
GLOVE BIO SURGEON STRL SZ8 (GLOVE) ×1 IMPLANT
GLOVE BIOGEL PI IND STRL 7.0 (GLOVE) IMPLANT
GOWN STRL REUS W/ TWL LRG LVL3 (GOWN DISPOSABLE) IMPLANT
GOWN STRL REUS W/ TWL XL LVL3 (GOWN DISPOSABLE) ×1 IMPLANT
GOWN STRL REUS W/TWL 2XL LVL3 (GOWN DISPOSABLE) IMPLANT
GOWN STRL REUS W/TWL LRG LVL3 (GOWN DISPOSABLE)
GOWN STRL REUS W/TWL XL LVL3 (GOWN DISPOSABLE) ×1
HEMOSTAT POWDER KIT SURGIFOAM (HEMOSTASIS) ×1 IMPLANT
KIT BASIN OR (CUSTOM PROCEDURE TRAY) ×1 IMPLANT
KIT TURNOVER KIT B (KITS) ×1 IMPLANT
NDL HYPO 25X1 1.5 SAFETY (NEEDLE) ×1 IMPLANT
NDL SPNL 20GX3.5 QUINCKE YW (NEEDLE) ×1 IMPLANT
NEEDLE HYPO 25X1 1.5 SAFETY (NEEDLE) ×1 IMPLANT
NEEDLE SPNL 20GX3.5 QUINCKE YW (NEEDLE) ×1 IMPLANT
NS IRRIG 1000ML POUR BTL (IV SOLUTION) ×1 IMPLANT
PACK LAMINECTOMY NEURO (CUSTOM PROCEDURE TRAY) ×1 IMPLANT
PAD ARMBOARD 7.5X6 YLW CONV (MISCELLANEOUS) ×1 IMPLANT
PIN DISTRACTION 14MM (PIN) IMPLANT
PLATE LOCK ACP INSIG 56 3L (Plate) IMPLANT
PUTTY BONE 2.5CC (Putty) IMPLANT
SCREW VA SINGLE LEAD 4X16 (Screw) IMPLANT
SOL ELECTROSURG ANTI STICK (MISCELLANEOUS) ×1
SOLUTION ELECTROSURG ANTI STCK (MISCELLANEOUS) ×1 IMPLANT
SPACER IDENTITI 6X16X14 7D (Screw) IMPLANT
SPACER IDENTITI 7X16X14 7D (Spacer) IMPLANT
SPONGE INTESTINAL PEANUT (DISPOSABLE) ×1 IMPLANT
SPONGE SURGIFOAM ABS GEL 100 (HEMOSTASIS) IMPLANT
STRIP CLOSURE SKIN 1/2X4 (GAUZE/BANDAGES/DRESSINGS) ×1 IMPLANT
SUT VIC AB 3-0 SH 8-18 (SUTURE) ×1 IMPLANT
SUT VICRYL 4-0 PS2 18IN ABS (SUTURE) IMPLANT
TOWEL GREEN STERILE (TOWEL DISPOSABLE) ×1 IMPLANT
TOWEL GREEN STERILE FF (TOWEL DISPOSABLE) ×1 IMPLANT
WATER STERILE IRR 1000ML POUR (IV SOLUTION) ×1 IMPLANT

## 2022-08-15 NOTE — Anesthesia Procedure Notes (Signed)
Arterial Line Insertion Start/End6/09/2022 1:10 PM, 08/15/2022 1:10 PM Performed by: Gaynelle Adu, MD, Dorie Rank, CRNA, CRNA  Patient location: Pre-op. Preanesthetic checklist: patient identified, IV checked, site marked, risks and benefits discussed, surgical consent, monitors and equipment checked, pre-op evaluation, timeout performed and anesthesia consent Lidocaine 1% used for infiltration Right, radial was placed Catheter size: 20 G Hand hygiene performed  and maximum sterile barriers used   Attempts: 1 Procedure performed without using ultrasound guided technique. Following insertion, dressing applied and Biopatch. Post procedure assessment: normal and unchanged  Patient tolerated the procedure well with no immediate complications.

## 2022-08-15 NOTE — H&P (Signed)
Subjective:   Patient is a 77 y.o. male admitted for myelopathy. The patient first presented to me with complaints of numbness of the arm(s) and spastic gait. Onset of symptoms was a few months ago. The pain is described as aching and occurs intermittently. The pain is rated mild, and is located in the neck and radiates to the arms. The symptoms have been progressive. Symptoms are exacerbated by extending head backwards, and are relieved by none.  Previous work up includes MRI of cervical spine, results: spinal stenosis.  Past Medical History:  Diagnosis Date   HFrEF (heart failure with reduced ejection fraction) (HCC)    History of kidney stones    White coat syndrome with diagnosis of hypertension     Past Surgical History:  Procedure Laterality Date   ANKLE FRACTURE SURGERY Right    KIDNEY SURGERY Left    TONSILLECTOMY      No Known Allergies  Social History   Tobacco Use   Smoking status: Former    Types: Cigarettes   Smokeless tobacco: Never  Substance Use Topics   Alcohol use: Not Currently    History reviewed. No pertinent family history. Prior to Admission medications   Medication Sig Start Date End Date Taking? Authorizing Provider  ascorbic acid (VITAMIN C) 500 MG tablet Take 1,500 mg by mouth daily.   Yes [provider]  carvedilol (COREG) 3.125 MG tablet Take 3.125 mg by mouth 2 (two) times daily with a meal. 08/08/22  Yes [provider]  Cholecalciferol (VITAMIN D-3 PO) Take 3,250 Units by mouth daily.   Yes [provider]  Multiple Vitamins-Minerals (IMMUNE SUPPORT PO) Take 2 tablets by mouth daily.   Yes [provider]  OVER THE COUNTER MEDICATION Take 1 tablet by mouth daily. Liver support   Yes [provider]  OVER THE COUNTER MEDICATION Take 1 capsule by mouth in the morning and at bedtime. Mushroom Blend   Yes [provider]  Probiotic Product (PROBIOTIC PO) Take 1 capsule by mouth daily.   Yes  [provider]  sacubitril-valsartan (ENTRESTO) 24-26 MG Take 1 tablet by mouth 2 (two) times daily. 08/08/22  Yes [provider]     Review of Systems  Positive ROS: neg  All other systems have been reviewed and were otherwise negative with the exception of those mentioned in the HPI and as above.  Objective: Vital signs in last 24 hours: Temp:  [98.1 F (36.7 C)] 98.1 F (36.7 C) (06/07 0930) Pulse Rate:  [85] 85 (06/07 0930) Resp:  [18] 18 (06/07 0930) BP: (178)/(106) 178/106 (06/07 0930) SpO2:  [98 %] 98 % (06/07 0930) Weight:  [61.2 kg] 61.2 kg (06/07 0930)  General Appearance: Alert, cooperative, no distress, appears stated age Head: Normocephalic, without obvious abnormality, atraumatic Eyes: PERRL, conjunctiva/corneas clear, EOM's intact      Neck: Supple, symmetrical, trachea midline, Back: Symmetric, no curvature, ROM normal, no CVA tenderness Lungs:  respirations unlabored Heart: Regular rate and rhythm Abdomen: Soft, non-tender Extremities: Extremities normal, atraumatic, no cyanosis or edema Pulses: 2+ and symmetric all extremities Skin: Skin color, texture, turgor normal, no rashes or lesions  NEUROLOGIC:  Mental status: Alert and oriented x4, no aphasia, good attention span, fund of knowledge and memory  Motor Exam - grossly normal Sensory Exam - grossly normal Reflexes: 3+ Coordination - grossly normal Gait - spastic Balance - grossly normal Cranial Nerves: I: smell Not tested  II: visual acuity  OS: nl    OD:  nl  II: visual fields Full to confrontation  II: pupils Equal, round, reactive to light  III,VII: ptosis None  III,IV,VI: extraocular muscles  Full ROM  V: mastication Normal  V: facial light touch sensation  Normal  V,VII: corneal reflex  Present  VII: facial muscle function - upper  Normal  VII: facial muscle function - lower Normal  VIII: hearing Not tested  IX: soft palate elevation  Normal  IX,X: gag reflex Present   XI: trapezius strength  5/5  XI: sternocleidomastoid strength 5/5  XI: neck flexion strength  5/5  XII: tongue strength  Normal    Data Review Lab Results  Component Value Date   WBC 8.4 08/08/2022   HGB 16.8 08/08/2022   HCT 50.4 08/08/2022   MCV 91.0 08/08/2022   PLT 262 08/08/2022   Lab Results  Component Value Date   NA 133 (L) 08/08/2022   K 3.5 08/08/2022   CL 99 08/08/2022   CO2 24 08/08/2022   BUN 20 08/08/2022   CREATININE 1.12 08/08/2022   GLUCOSE 103 (H) 08/08/2022   Lab Results  Component Value Date   INR 0.9 08/08/2022    Assessment:   Cervical neck pain with herniated nucleus pulposus/ spondylosis/ stenosis at C4-5 C5-6 C6-7. Estimated body mass index is 21.14 kg/m as calculated from the following:   Height as of this encounter: 5\' 7"  (1.702 m).   Weight as of this encounter: 61.2 kg.  Patient has failed conservative therapy. Planned surgery : ACDF with plate Z6-1 W9-6 C6-7  Plan:   I explained the condition and procedure to the patient and answered any questions.  Patient wishes to proceed with procedure as planned. Understands risks/ benefits/ and expected or typical outcomes.  Tia Alert 08/15/2022 11:29 AM

## 2022-08-15 NOTE — Anesthesia Postprocedure Evaluation (Signed)
Anesthesia Post Note  Patient: Marco Alvarado  Procedure(s) Performed: Anterior Cervical Decompression Fusion  - Cervical four-Cervical five - Cervical five-Cervical six - Cervical six-Cervical seven     Patient location during evaluation: PACU Anesthesia Type: General Level of consciousness: awake and alert Pain management: pain level controlled Vital Signs Assessment: post-procedure vital signs reviewed and stable Respiratory status: spontaneous breathing, nonlabored ventilation and respiratory function stable Cardiovascular status: blood pressure returned to baseline and stable Postop Assessment: no apparent nausea or vomiting Anesthetic complications: no  No notable events documented.  Last Vitals:  Vitals:   08/15/22 1545 08/15/22 1600  BP: (!) 135/95 (!) 147/86  Pulse: 81 80  Resp: 18 15  Temp:    SpO2: 95% 92%    Last Pain:  Vitals:   08/15/22 1000  PainSc: 2     LLE Motor Response: Purposeful movement (08/15/22 1600) LLE Sensation: Full sensation (08/15/22 1600) RLE Motor Response: Purposeful movement (08/15/22 1600) RLE Sensation: Full sensation (08/15/22 1600)      ,W. EDMOND

## 2022-08-15 NOTE — Op Note (Signed)
08/15/2022  2:51 PM  PATIENT:  Marco Alvarado  77 y.o. 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 Decompressive anterior cervical discectomy C4-5 C5-6 C6-7, 2. Anterior cervical arthrodesis C4-5, C5-6, C6-7 utilizing a porous titanium interbody cage packed with locally harvested morcellized autologous bone graft and DBM putty, 3. Anterior cervical plating C4-C7 inclusive utilizing a Alphatec plate  SURGEON:  Marikay Alar, MD  ASSISTANTS: Verlin Dike, FNP  ANESTHESIA:   General  EBL: 100 ml  Total I/O In: 900 [I.V.:900] Out: 100 [Blood:100]  BLOOD ADMINISTERED: none  DRAINS: none  SPECIMEN:  none  INDICATION FOR PROCEDURE: This patient presented with difficulty with gait numbness tingling in the hands with loss of dexterity. Imaging showed severe cervical spinal stenosis with signal change in the spinal cord. The patient tried conservative measures without relief. Pain was debilitating. Recommended ACDF with plating. Patient understood the risks, benefits, and alternatives and potential outcomes and wished to proceed.  PROCEDURE DETAILS: Patient was brought to the operating room placed under general endotracheal anesthesia. Patient was placed in the supine position on the operating room table. The neck was prepped with Duraprep and draped in a sterile fashion.   Three cc of local anesthesia was injected and a transverse incision was made on the right side of the neck.  Dissection was carried down thru the subcutaneous tissue and the platysma was  elevated, opened, and undermined with Metzenbaum scissors.  Dissection was then carried out thru an avascular plane leaving the sternocleidomastoid carotid artery and jugular vein laterally and the trachea and esophagus medially with the assistance of my nurse practitioner. The ventral aspect of the  vertebral column was identified and a localizing x-ray was taken. The C5-6 and C4-5 level was identified and all in the room agreed with the level. The longus colli muscles were then elevated and the retractor was placed with the assistance of my nurse practitioner to expose C4-5 C5-6 and C6-7. The annulus was incised and the disc space entered. Discectomy was performed with micro-curettes and pituitary rongeurs. I then used the high-speed drill to drill the endplates down to the level of the posterior longitudinal ligament. The drill shavings were saved in a mucous trap for later arthrodesis. The operating microscope was draped and brought into the field provided additional magnification, illumination and visualization. Discectomy was continued posteriorly thru the disc space. Posterior longitudinal ligament was opened with a nerve hook, and then removed along with disc herniation and osteophytes, decompressing the spinal canal and thecal sac. We then continued to remove osteophytic overgrowth and disc material decompressing the neural foramina and exiting nerve roots bilaterally. The scope was angled up and down to help decompress and undercut the vertebral bodies. Once the decompression was completed we could pass a nerve hook circumferentially to assure adequate decompression in the midline and in the neural foramina. So by both visualization and palpation we felt we had an adequate decompression of the neural elements. We then measured the height of the intravertebral disc space and selected a 6 millimeter PTI interbody cage packed with autograft and DBM for C4-5 and C6-7 and a 7 mm graft for C5-6. It was then gently positioned in the intravertebral disc space(s) and countersunk. I then used a ATEC plate and placed 16 mm variable angle screws into the vertebral bodies of each level C4, C5, C6, and C7 and locked them into position. The  wound was irrigated with bacitracin solution, checked for hemostasis which  was established and confirmed. Once meticulous hemostasis was achieved, we then proceeded with closure with the assistance of my nurse practitioner. The platysma was closed with interrupted 3-0 undyed Vicryl suture, the subcuticular layer was closed with interrupted 3-0 undyed Vicryl suture. The skin edges were approximated with steristrips. The drapes were removed. A sterile dressing was applied. The patient was then awakened from general anesthesia and transferred to the recovery room in stable condition. At the end of the procedure all sponge, needle and instrument counts were correct.   PLAN OF CARE: Admit to inpatient   PATIENT DISPOSITION:  PACU - hemodynamically stable.   Delay start of Pharmacological VTE agent (>24hrs) due to surgical blood loss or risk of bleeding:  yes

## 2022-08-15 NOTE — Transfer of Care (Signed)
Immediate Anesthesia Transfer of Care Note  Patient: Marco Alvarado  Procedure(s) Performed: Anterior Cervical Decompression Fusion  - Cervical four-Cervical five - Cervical five-Cervical six - Cervical six-Cervical seven  Patient Location: PACU  Anesthesia Type:General  Level of Consciousness: awake and alert   Airway & Oxygen Therapy: Patient Spontanous Breathing  Post-op Assessment: Report given to RN and Post -op Vital signs reviewed and stable  Post vital signs: Reviewed and stable  Last Vitals:  Vitals Value Taken Time  BP 163/110 08/15/22 1510  Temp    Pulse 85 08/15/22 1511  Resp 16 08/15/22 1511  SpO2 92 % 08/15/22 1511  Vitals shown include unvalidated device data.  Last Pain:  Vitals:   08/15/22 1000  PainSc: 2          Complications: No notable events documented.

## 2022-08-15 NOTE — Anesthesia Procedure Notes (Signed)
Procedure Name: Intubation Date/Time: 08/15/2022 12:13 PM  Performed by: Darryl Nestle, CRNAPre-anesthesia Checklist: Patient identified, Emergency Drugs available, Suction available and Patient being monitored Patient Re-evaluated:Patient Re-evaluated prior to induction Oxygen Delivery Method: Circle system utilized Preoxygenation: Pre-oxygenation with 100% oxygen Induction Type: IV induction and Rapid sequence Ventilation: Mask ventilation without difficulty Laryngoscope Size: Mac, 3 and Glidescope Grade View: Grade I Tube type: Oral Tube size: 7.0 mm Number of attempts: 1 Airway Equipment and Method: Stylet and Oral airway Placement Confirmation: ETT inserted through vocal cords under direct vision, positive ETCO2 and breath sounds checked- equal and bilateral Secured at: 22 cm Tube secured with: Tape Dental Injury: Teeth and Oropharynx as per pre-operative assessment

## 2022-08-16 ENCOUNTER — Encounter (HOSPITAL_COMMUNITY): Payer: Self-pay | Admitting: Neurological Surgery

## 2022-08-16 MED ORDER — HYDROCODONE-ACETAMINOPHEN 7.5-325 MG PO TABS: 1.0000 | ORAL_TABLET | ORAL | 0 refills | Status: DC | PRN

## 2022-08-16 MED ORDER — METHOCARBAMOL 500 MG PO TABS: 500.0000 mg | ORAL_TABLET | Freq: Four times a day (QID) | ORAL | 1 refills | Status: DC | PRN

## 2022-08-16 NOTE — Evaluation (Addendum)
Occupational Therapy Evaluation Patient Details Name: Marco Alvarado MRN: [id number] DOB: Apr 28, 1945 Today's Date: 08/16/2022   History of Present Illness 77 y.o. male admitted for myelopathy. The patient first presented to me with complaints of numbness of the arm(s) and spastic gait. He is s/p Decompressive anterior cervical discectomy C4-5 C5-6 C6-7, and Anterior cervical arthrodesis C4-5, C5-6, C6-7 on 08/15/22. PMH of HFrEF, kidney stones, HTN, white coat syndrome   Clinical Impression   Pt admitted for above dx, PTA pt reports being mod I in ambulation with SPC and being ind in bADLs. Pt currently presenting with numbness in bilat finger tips and ataxic gait. Pt ambulating with Supervision in hall using RW but in room without UE support pt needing min to mod A to balance dynamically. Advised pt to use a RW and/or SPC upon DC home. Educated pt on cervical precautions, handout provided, pt return demonstrated understanding and completed bADLs while maintaining precautions. Discussed pt follow up with outpatient OT if numbness does not progress in a few weeks. Also gave pt instructions to perform back precautions for comfort due to back pain hx. Pt successfully went up/down one flight of stairs, instructed pt to go up and down the stairs via facing the rail and going sideways due to unsteadiness. Pt has no further acute skilled OT needs at this time, No follow-up OT recommended.       Recommendations for follow up therapy are one component of a multi-disciplinary discharge planning process, led by the attending physician.  Recommendations may be updated based on patient status, additional functional criteria and insurance authorization.   Assistance Recommended at Discharge Set up Supervision/Assistance  Patient can return home with the following Assistance with cooking/housework;Assist for transportation    Functional Status Assessment  Patient has had a recent decline in their functional status  and demonstrates the ability to make significant improvements in function in a reasonable and predictable amount of time.  Equipment Recommendations  Other (comment) (RW)    Recommendations for Other Services       Precautions / Restrictions Precautions Precautions: Cervical Precaution Booklet Issued: Yes (comment) Precaution Comments: back precautions for comfort Restrictions Weight Bearing Restrictions: No      Mobility Bed Mobility Overal bed mobility: Modified Independent             General bed mobility comments: Educated pt on the use of log roll, he returned demonstrated understanding. Using bed rails to assist with rolling    Transfers Overall transfer level: Needs assistance Equipment used: None Transfers: Sit to/from Stand Sit to Stand: Supervision           General transfer comment: STSx3 no AD, upon ambulation he was unsteady and ataxic. Given RW for stability      Balance Overall balance assessment: Needs assistance Sitting-balance support: Feet supported Sitting balance-Leahy Scale: Good     Standing balance support: During functional activity, Bilateral upper extremity supported Standing balance-Leahy Scale: Fair Standing balance comment: Stands statically without support, much more stable dynamically with UE support                           ADL either performed or assessed with clinical judgement   ADL Overall ADL's : Needs assistance/impaired Eating/Feeding: Independent;Sitting   Grooming: Sitting;Supervision/safety   Upper Body Bathing: Sitting;Independent   Lower Body Bathing: Sitting/lateral leans;Supervison/ safety   Upper Body Dressing : Sitting;Modified independent;Cueing for compensatory techniques Upper Body Dressing Details (indicate cue type and  reason): cues to sequence steps with new neck precautions Lower Body Dressing: Sitting/lateral leans;Supervision/safety Lower Body Dressing Details (indicate cue type and  reason): using figure four technique to don underwear and shorts Toilet Transfer: Ambulation;Rolling walker (2 wheels);Supervision/safety   Toileting- Clothing Manipulation and Hygiene: Supervision/safety;Sitting/lateral lean       Functional mobility during ADLs: Supervision/safety;Rolling walker (2 wheels)       Vision Baseline Vision/History: 1 Wears glasses       Perception     Praxis      Pertinent Vitals/Pain Pain Assessment Pain Assessment: 0-10 Pain Score: 2  Pain Location: R glutes Pain Descriptors / Indicators: Sore, Radiating Pain Intervention(s): Monitored during session, Repositioned     Hand Dominance     Extremity/Trunk Assessment Upper Extremity Assessment Upper Extremity Assessment: Generalized weakness RUE Sensation:  (numbness in all fingertips) LUE Sensation:  (numbness in all fingertips)   Lower Extremity Assessment Lower Extremity Assessment: Overall WFL for tasks assessed   Cervical / Trunk Assessment Cervical / Trunk Assessment: Neck Surgery;Other exceptions Cervical / Trunk Exceptions: scoliosis   Communication Communication Communication: No difficulties   Cognition Arousal/Alertness: Awake/alert Behavior During Therapy: WFL for tasks assessed/performed Overall Cognitive Status: Within Functional Limits for tasks assessed                                       General Comments  VSS on RA    Exercises     Shoulder Instructions      Home Living Family/patient expects to be discharged to:: Private residence Living Arrangements: Spouse/significant other Available Help at Discharge: Family;Available 24 hours/day Type of Home: House Home Access: Stairs to enter [employer] of Steps: 3 Entrance Stairs-Rails: Right Home Layout: Two level;Able to live on main level with bedroom/bathroom Alternate Level Stairs-Number of Steps: does not use   Bathroom Shower/Tub: Producer, television/film/video:  (low  toilet seat) Bathroom Accessibility: Yes   Home Equipment: Shower seat;Grab bars - toilet;Grab bars - tub/shower;Cane - single point;Wheelchair - manual          Prior Functioning/Environment Prior Level of Function : Independent/Modified Independent             Mobility Comments: ambulated with SPC ADLs Comments: ind        OT Problem List: Impaired balance (sitting and/or standing)      OT Treatment/Interventions:      OT Goals(Current goals can be found in the care plan section) Acute Rehab OT Goals Patient Stated Goal: To go home OT Goal Formulation: With patient/family Time For Goal Achievement: 08/30/22 Potential to Achieve Goals: Good  OT Frequency:      Co-evaluation              AM-PAC OT "6 Clicks" Daily Activity     Outcome Measure Help from another person eating meals?: None Help from another person taking care of personal grooming?: A Little Help from another person toileting, which includes using toliet, bedpan, or urinal?: A Little Help from another person bathing (including washing, rinsing, drying)?: A Little Help from another person to put on and taking off regular upper body clothing?: None Help from another person to put on and taking off regular lower body clothing?: A Little 6 Click Score: 20   End of Session Equipment Utilized During Treatment: Gait belt;Rolling walker (2 wheels) Nurse Communication: Mobility status  Activity Tolerance: Patient tolerated treatment well  Patient left: in bed;with call bell/phone within reach;with family/visitor present  OT Visit Diagnosis: Unsteadiness on feet (R26.81);Ataxia, unspecified (R27.0)                Time: 1610-9604 OT Time Calculation (min): 25 min Charges:  OT General Charges $OT Visit: 1 Visit OT Evaluation $OT Eval Low Complexity: 1 Low OT Treatments $Self Care/Home Management : 8-22 mins  08/16/2022  AB, OTR/L  Acute Rehabilitation Services  Office: [phone]   Tristan Schroeder 08/16/2022, 4:11 PM

## 2022-08-16 NOTE — Progress Notes (Signed)
Pt needs a RW. Met with pt and wife. They agreed to use [facility] for DME. Contacted Jasmine with [facility] for DME referral. She accepted the referral.

## 2022-08-18 MED FILL — Thrombin For Soln 5000 Unit: CUTANEOUS | Qty: 5000 | Status: AC

## 2022-08-18 NOTE — Discharge Summary (Signed)
Physician Discharge Summary  Patient ID: Marco Alvarado MRN: [id number] DOB/AGE: June 05, 1945 77 y.o.  Admit date: 08/15/2022 Discharge date: 08/16/22  Admission Diagnoses: Cervical spondylosis, cervical spinal stenosis, cervical myelopathy  Discharge Diagnoses: The same Principal Problem:   S/P cervical spinal fusion   Discharged Condition: good  Hospital Course: Dr. Yetta Barre performed a C4-5, C5-6 and C6-7 anterior cervicectomy fusion plating on the patient on 08/15/2022.  The patient's postoperative course was unremarkable.  On postoperative day #1 the patient requested discharge to home.  He was given verbal and written discharge instructions.  All his questions were answered.  Consults: None Significant Diagnostic Studies: None Treatments: C4-5, C5-6 and C6-7 anterior cervicectomy fusion and plating. Discharge Exam: Blood pressure (!) 156/83, pulse 82, temperature 98.4 F (36.9 C), temperature source Oral, resp. rate 14, height 5\' 7"  (1.702 m), weight 61.2 kg, SpO2 99 %. The patient is alert and pleasant.  He looks well.  His strength is normal.  His dressing is clean and dry.  There is no hematoma or shift.  Disposition: Home   Allergies as of 08/16/2022   No Known Allergies      Medication List     TAKE these medications    ascorbic acid 500 MG tablet Commonly known as: VITAMIN C Take 1,500 mg by mouth daily.   carvedilol 3.125 MG tablet Commonly known as: COREG Take 3.125 mg by mouth 2 (two) times daily with a meal.   HYDROcodone-acetaminophen 7.5-325 MG tablet Commonly known as: NORCO Take 1 tablet by mouth every 4 (four) hours as needed for moderate pain ((score 4 to 6)).   IMMUNE SUPPORT PO Take 2 tablets by mouth daily.   methocarbamol 500 MG tablet Commonly known as: ROBAXIN Take 1 tablet (500 mg total) by mouth every 6 (six) hours as needed for muscle spasms.   OVER THE COUNTER MEDICATION Take 1 tablet by mouth daily. Liver support   OVER THE COUNTER  MEDICATION Take 1 capsule by mouth in the morning and at bedtime. Mushroom Blend   PROBIOTIC PO Take 1 capsule by mouth daily.   sacubitril-valsartan 24-26 MG Commonly known as: ENTRESTO Take 1 tablet by mouth 2 (two) times daily.   VITAMIN D-3 PO Take 3,250 Units by mouth daily.        Follow-up Information     Arman Bogus, MD Follow up in 2 week(s).   Specialty: Neurosurgery Contact information: [address] [place] [address] [phone]                 Signed: Cristi Loron 08/18/2022, 7:29 AM

## 2022-09-21 IMAGING — CR DG LUMBAR SPINE COMPLETE 4+V
5 series · 5 of 5 positions shown · non-contrast
Comparison: None.

CLINICAL DATA: Left-sided low back pain, left side sciatica.

EXAM:
LUMBAR SPINE - COMPLETE 4+ VIEW

[w lumbar spine ap]
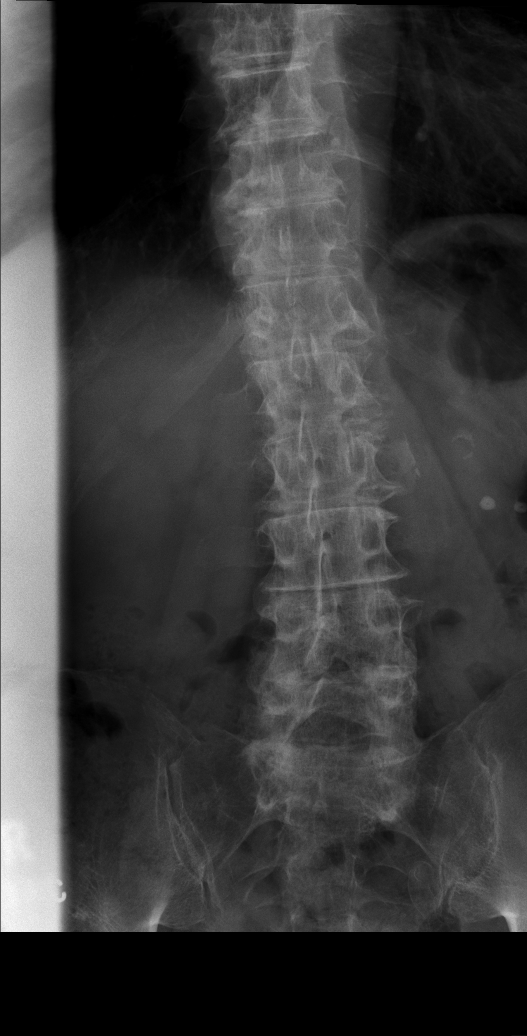

[w lumbar spine obl (1 of 2)]
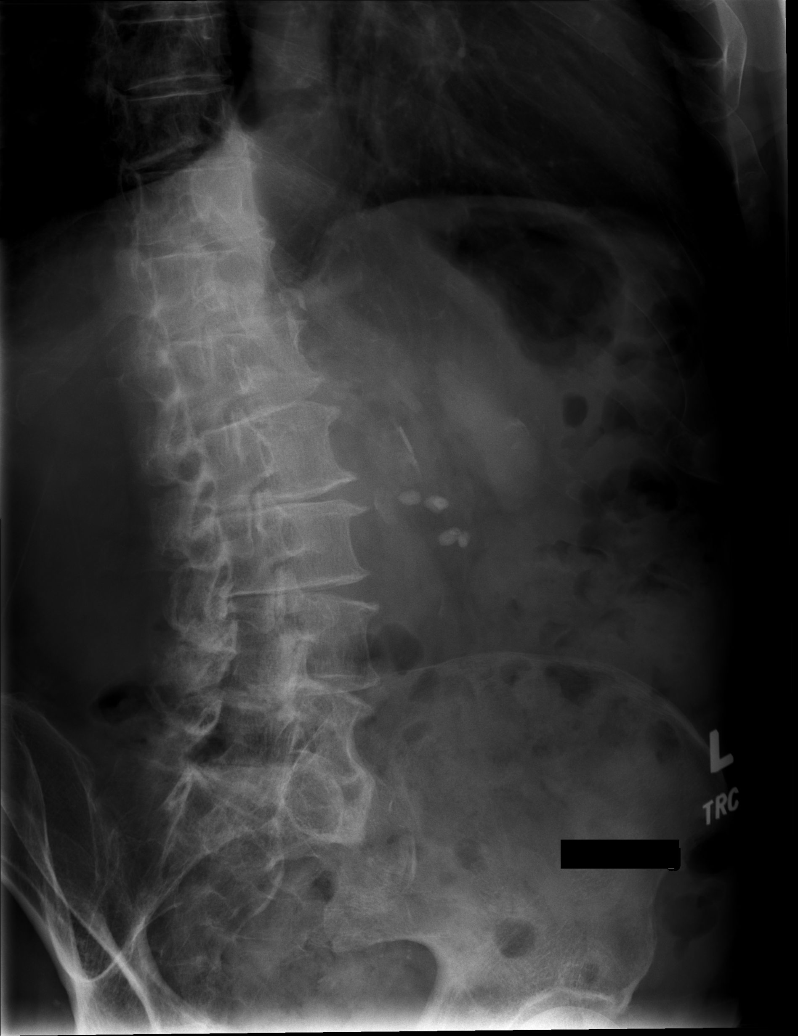

[w lumbar spine obl (2 of 2)]
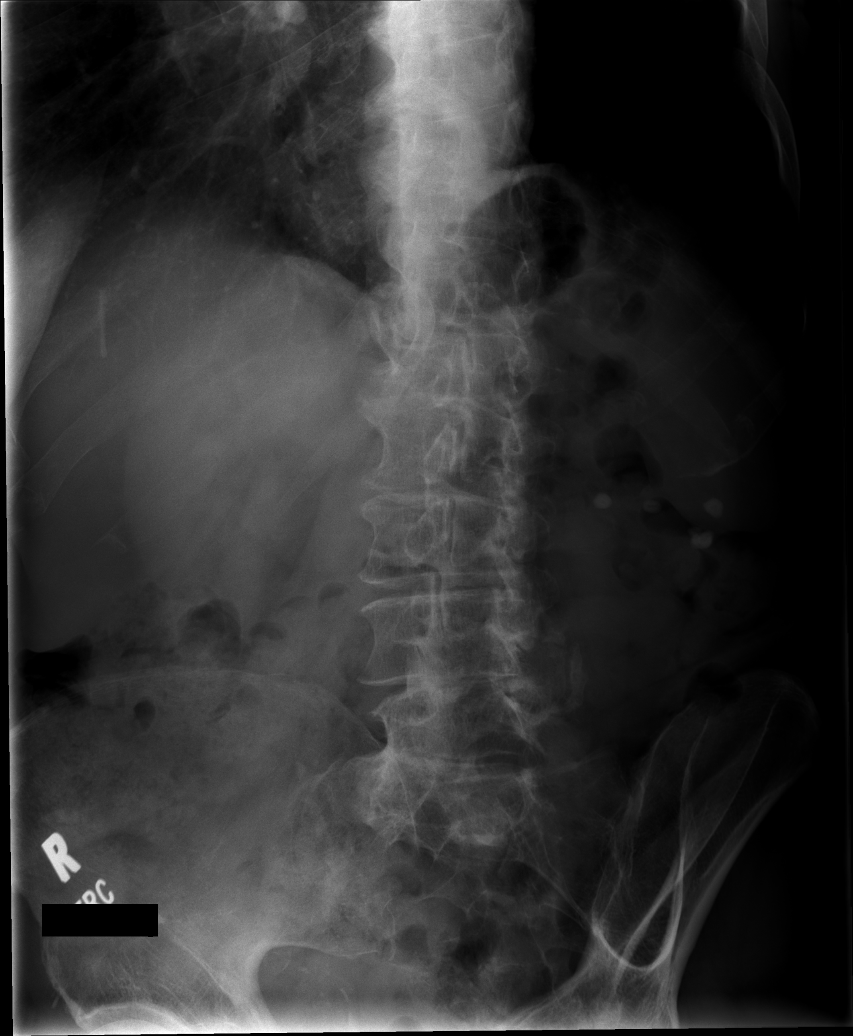

[w lumbar spine lat]
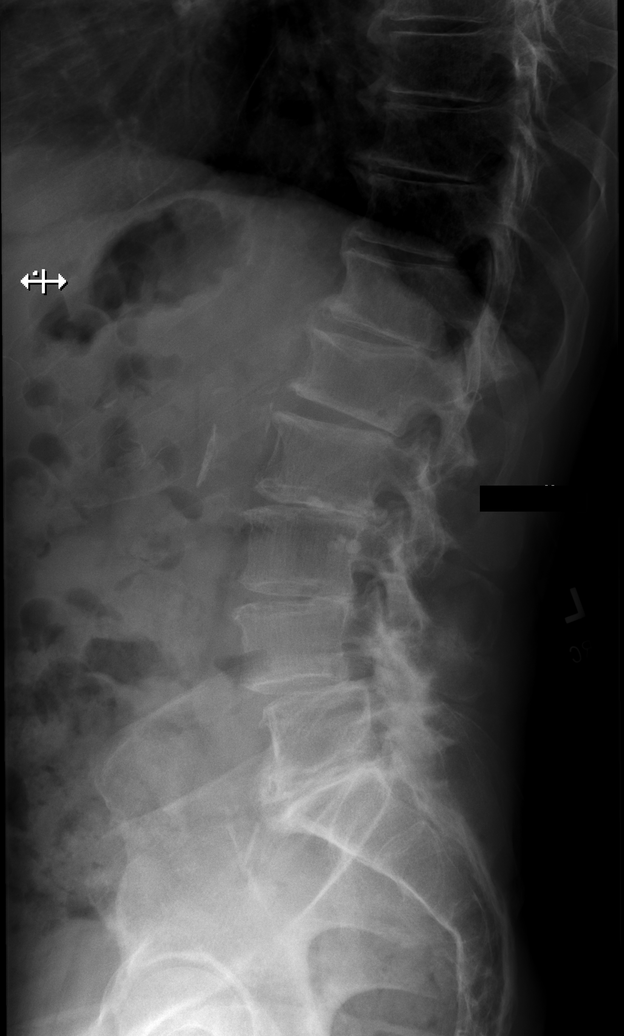

[w lumbar l-5 s-1 spot]
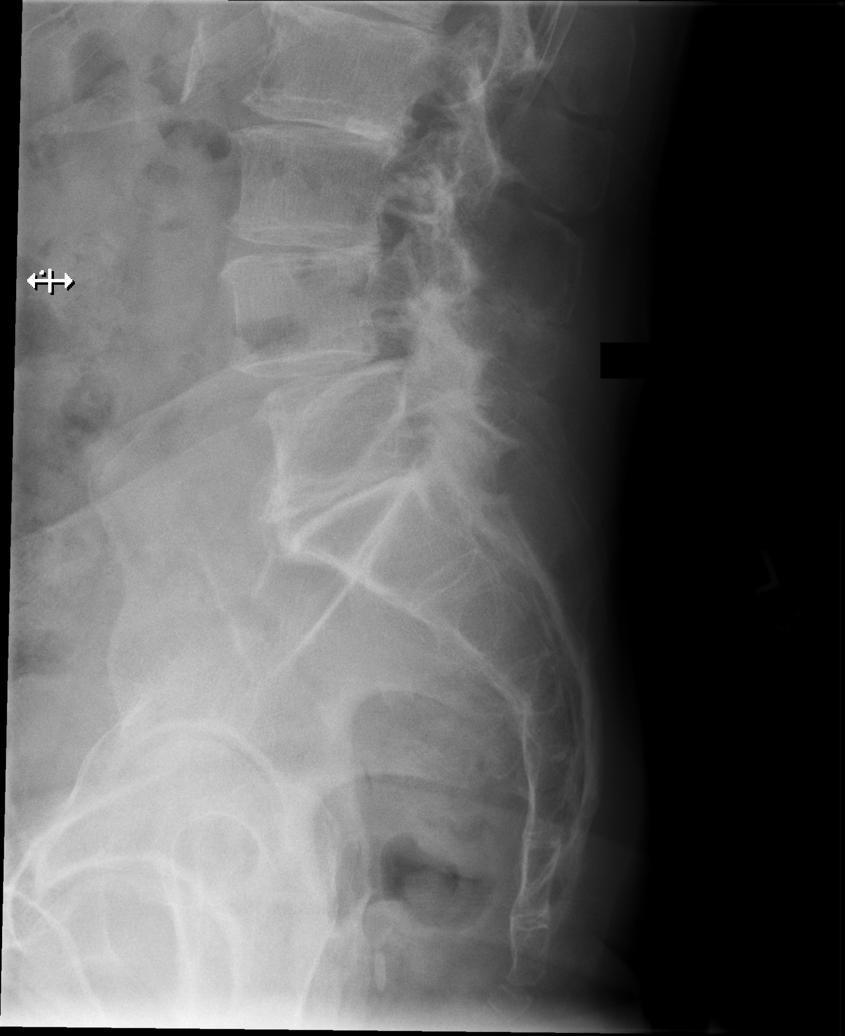

[5 of 5 positions shown; findings below may reference images not displayed]

FINDINGS: The bones are osteopenic. There is 5 mm of anterolisthesis at L4-L5
which is favored as degenerative. Alignment is otherwise anatomic.
There is mild compression deformity of the superior endplate of L1
without retropulsion of fracture fragments. There is moderate disc
space narrowing throughout the lumbar spine most significant at
L2-L3 and L5-S1. Endplate osteophytes are seen throughout, most
significant at L5-S1. There is facet arthropathy at L4-L5 and L5-S1.
There are atherosclerotic calcifications of the aorta.
IMPRESSION: 1. Mild compression deformity of L1 is favored as chronic, but age
indeterminate. Please correlate clinically.
2. Grade 1 anterolisthesis at L4-L5 is favored as degenerative.
3. Moderate severe multilevel degenerative changes.

## 2022-09-22 ENCOUNTER — Other Ambulatory Visit: Payer: Self-pay | Admitting: Neurological Surgery

## 2022-09-30 NOTE — Pre-Procedure Instructions (Signed)
Surgical Instructions   Your procedure is scheduled on Friday, August 2nd. Report to [facility] Main Entrance "A" at 05:30 A.M., then check in with the Admitting office. Any questions or running late day of surgery: call [phone]  Questions prior to your surgery date: call [phone], Monday-Friday, 8am-4pm. If you experience any cold or flu symptoms such as cough, fever, chills, shortness of breath, etc. between now and your scheduled surgery, please notify us at the above number.     Remember:  Do not eat or drink after midnight the night before your surgery     Take these medicines the morning of surgery with A SIP OF WATER  carvedilol (COREG)     One week prior to surgery, STOP taking any Aspirin (unless otherwise instructed by your surgeon) Aleve, Naproxen, Ibuprofen, Motrin, Advil, Goody's, BC's, all herbal medications, fish oil, and non-prescription vitamins.                     Do NOT Smoke (Tobacco/Vaping) for 24 hours prior to your procedure.  If you use a CPAP at night, you may bring your mask/headgear for your overnight stay.   You will be asked to remove any contacts, glasses, piercing's, hearing aid's, dentures/partials prior to surgery. Please bring cases for these items if needed.    Patients discharged the day of surgery will not be allowed to drive home, and someone needs to stay with them for 24 hours.  SURGICAL WAITING ROOM VISITATION Patients may have no more than 2 support people in the waiting area - these visitors may rotate.   Pre-op nurse will coordinate an appropriate time for 1 ADULT support person, who may not rotate, to accompany patient in pre-op.  Children under the age of 80 must have an adult with them who is not the patient and must remain in the main waiting area with an adult.  If the patient needs to stay at the hospital during part of their recovery, the visitor guidelines for inpatient rooms apply.  Please refer to the [facility]  website for the visitor guidelines for any additional information.   If you received a COVID test during your pre-op visit  it is requested that you wear a mask when out in public, stay away from anyone that may not be feeling well and notify your surgeon if you develop symptoms. If you have been in contact with anyone that has tested positive in the last 10 days please notify you surgeon.      Pre-operative 5 CHG Bathing Instructions   You can play a key role in reducing the risk of infection after surgery. Your skin needs to be as free of germs as possible. You can reduce the number of germs on your skin by washing with CHG (chlorhexidine gluconate) soap before surgery. CHG is an antiseptic soap that kills germs and continues to kill germs even after washing.   DO NOT use if you have an allergy to chlorhexidine/CHG or antibacterial soaps. If your skin becomes reddened or irritated, stop using the CHG and notify one of our RNs at [phone].   Please shower with the CHG soap starting 4 days before surgery using the following schedule:     Please keep in mind the following:  DO NOT shave, including legs and underarms, starting the day of your first shower.   You may shave your face at any point before/day of surgery.  Place clean sheets on your bed the day you  start using CHG soap. Use a clean washcloth (not used since being washed) for each shower. DO NOT sleep with pets once you start using the CHG.   CHG Shower Instructions:  If you choose to wash your hair and private area, wash first with your normal shampoo/soap.  After you use shampoo/soap, rinse your hair and body thoroughly to remove shampoo/soap residue.  Turn the water OFF and apply about 3 tablespoons (45 ml) of CHG soap to a CLEAN washcloth.  Apply CHG soap ONLY FROM YOUR NECK DOWN TO YOUR TOES (washing for 3-5 minutes)  DO NOT use CHG soap on face, private areas, open wounds, or sores.  Pay special attention to the area  where your surgery is being performed.  If you are having back surgery, having someone wash your back for you may be helpful. Wait 2 minutes after CHG soap is applied, then you may rinse off the CHG soap.  Pat dry with a clean towel  Put on clean clothes/pajamas   If you choose to wear lotion, please use ONLY the CHG-compatible lotions on the back of this paper.   Additional instructions for the day of surgery: DO NOT APPLY any lotions, deodorants, cologne, or perfumes.   Do not bring valuables to the hospital. [facility] is not responsible for any belongings/valuables. Do not wear nail polish, gel polish, artificial nails, or any other type of covering on natural nails (fingers and toes) Do not wear jewelry or makeup Put on clean/comfortable clothes.  Please brush your teeth.  Ask your nurse before applying any prescription medications to the skin.     CHG Compatible Lotions   Aveeno Moisturizing lotion  Cetaphil Moisturizing Cream  Cetaphil Moisturizing Lotion  Clairol Herbal Essence Moisturizing Lotion, Dry Skin  Clairol Herbal Essence Moisturizing Lotion, Extra Dry Skin  Clairol Herbal Essence Moisturizing Lotion, Normal Skin  Curel Age Defying Therapeutic Moisturizing Lotion with Alpha Hydroxy  Curel Extreme Care Body Lotion  Curel Soothing Hands Moisturizing Hand Lotion  Curel Therapeutic Moisturizing Cream, Fragrance-Free  Curel Therapeutic Moisturizing Lotion, Fragrance-Free  Curel Therapeutic Moisturizing Lotion, Original Formula  Eucerin Daily Replenishing Lotion  Eucerin Dry Skin Therapy Plus Alpha Hydroxy Crme  Eucerin Dry Skin Therapy Plus Alpha Hydroxy Lotion  Eucerin Original Crme  Eucerin Original Lotion  Eucerin Plus Crme Eucerin Plus Lotion  Eucerin TriLipid Replenishing Lotion  Keri Anti-Bacterial Hand Lotion  Keri Deep Conditioning Original Lotion Dry Skin Formula Softly Scented  Keri Deep Conditioning Original Lotion, Fragrance Free Sensitive Skin  Formula  Keri Lotion Fast Absorbing Fragrance Free Sensitive Skin Formula  Keri Lotion Fast Absorbing Softly Scented Dry Skin Formula  Keri Original Lotion  Keri Skin Renewal Lotion Keri Silky Smooth Lotion  Keri Silky Smooth Sensitive Skin Lotion  Nivea Body Creamy Conditioning Oil  Nivea Body Extra Enriched Lotion  Nivea Body Original Lotion  Nivea Body Sheer Moisturizing Lotion Nivea Crme  Nivea Skin Firming Lotion  NutraDerm 30 Skin Lotion  NutraDerm Skin Lotion  NutraDerm Therapeutic Skin Cream  NutraDerm Therapeutic Skin Lotion  ProShield Protective Hand Cream  Provon moisturizing lotion  Please read over the following fact sheets that you were given.

## 2022-10-01 ENCOUNTER — Encounter (HOSPITAL_COMMUNITY): Payer: Self-pay

## 2022-10-01 ENCOUNTER — Encounter (HOSPITAL_COMMUNITY)
Admission: RE | Admit: 2022-10-01 | Discharge: 2022-10-01 | Disposition: A | Discharge status: Home / Self Care | Payer: Medicare Other | Source: Ambulatory Visit | Attending: Neurological Surgery | Admitting: Neurological Surgery

## 2022-10-01 ENCOUNTER — Other Ambulatory Visit: Payer: Self-pay

## 2022-10-01 VITALS — BP 136/88 | HR 73 | Temp 98.3°F | Resp 18 | Ht 67.0 in | Wt 130.0 lb

## 2022-10-01 DIAGNOSIS — Z01818 Encounter for other preprocedural examination: Secondary | ICD-10-CM | POA: Diagnosis present

## 2022-10-01 DIAGNOSIS — I429 Cardiomyopathy, unspecified: Secondary | ICD-10-CM | POA: Insufficient documentation

## 2022-10-01 DIAGNOSIS — I10 Essential (primary) hypertension: Secondary | ICD-10-CM | POA: Insufficient documentation

## 2022-10-01 DIAGNOSIS — Z01812 Encounter for preprocedural laboratory examination: Secondary | ICD-10-CM | POA: Insufficient documentation

## 2022-10-01 DIAGNOSIS — I447 Left bundle-branch block, unspecified: Secondary | ICD-10-CM | POA: Diagnosis not present

## 2022-10-01 HISTORY — DX: Cardiac murmur, unspecified: R01.1

## 2022-10-01 HISTORY — DX: Unspecified osteoarthritis, unspecified site: M19.90

## 2022-10-01 LAB — BASIC METABOLIC PANEL
Anion gap: 9 (ref 5–15)
BUN: 21 mg/dL (ref 8–23)
CO2: 25 mmol/L (ref 22–32)
Calcium: 9.7 mg/dL (ref 8.9–10.3)
Chloride: 101 mmol/L (ref 98–111)
Creatinine, Ser: 1.11 mg/dL (ref 0.61–1.24)
GFR, Estimated: >60 mL/min (ref >60)
Glucose, Bld: 98 mg/dL (ref 70–99)
Potassium: 4.5 mmol/L (ref 3.5–5.1)
Sodium: 135 mmol/L (ref 135–145)

## 2022-10-01 LAB — CBC
HCT: 45.3 % (ref 39.0–52.0)
Hemoglobin: 15 g/dL (ref 13.0–17.0)
MCH: 29.6 pg (ref 26.0–34.0)
MCHC: 33.1 g/dL (ref 30.0–36.0)
MCV: 89.3 fL (ref 80.0–100.0)
Platelets: 248 10*3/uL (ref 150–400)
RBC: 5.07 MIL/uL (ref 4.22–5.81)
RDW: 13.8 % (ref 11.5–15.5)
WBC: 8.7 10*3/uL (ref 4.0–10.5)
nRBC: 0 % (ref 0.0–0.2)

## 2022-10-01 LAB — TYPE AND SCREEN
ABO/RH(D): O POS
Antibody Screen: NEGATIVE

## 2022-10-01 LAB — PROTIME-INR
INR: 0.9 (ref 0.8–1.2)
Prothrombin Time: 12.4 seconds (ref 11.4–15.2)

## 2022-10-01 LAB — SURGICAL PCR SCREEN
MRSA, PCR: NEGATIVE
Staphylococcus aureus: NEGATIVE

## 2022-10-01 NOTE — Progress Notes (Signed)
PCP - [facility]/gallery curator - Dr. Dyane Dustman  PPM/ICD - denies   Chest x-ray - pt states he thinks the last time he had one was when he was a child in [place], [place] EKG - 08/08/22 Stress Test - 06/07/21 ECHO - 06/26/21 Cardiac Cath - denies  Sleep Study - denies   DM- denies  ASA/Blood Thinner Instructions: n/a   ERAS Protcol - no, NPO   COVID TEST- n/a   Anesthesia review: yes, cardiac hx. Pt reviewed this chart prior to surgery in May. Pt has an appt with cardiology (Dr. Chales Abrahams) tomorrow 7/25 at 1500. Pt instructed to make sure he is given cardiac clearance.  Patient denies shortness of breath, fever, cough and chest pain at PAT appointment   All instructions explained to the patient, with a verbal understanding of the material. Patient agrees to go over the instructions while at home for a better understanding. The opportunity to ask questions was provided.

## 2022-10-06 NOTE — Progress Notes (Signed)
Anesthesia Chart Review:  Follows with cardiology at [facility] for history of left bundle branch block, cardiomyopathy with LVEF of 30 to 35%, HTN.  Currently maintained on carvedilol and Entresto.  He was seen on 10/02/2022 for preop evaluation.  Per note, "Patient may proceed with planned surgery. His perioperative risk of myocardial infarction or cardiac arrest is 1.2% per the geriatric sensitive operative cardiac risk index table. Judicious management of fluids given his reduced LV ejection fraction strongly advised. He will continue GDMT with carvedilol and Entresto. Return to clinic in 3 months to reassess left ventricular function and further work up and discuss consideration of CRT therapy ."  Patient recently underwent C4-C7 ACDF on 08/15/2022 without complication.  Preop labs reviewed, WNL  EKG 08/08/2022: NSR.  Rate 83.  Possible LAE.  LAD.  Left ventricular hypertrophy with QRS widening and repolarization abnormality.  Cannot rule out septal infarct, age undetermined.   TTE 06/26/2021 (Care Everywhere): Adequate 2D, M mode, and color flow doppler echo demonstrates normal LV  chamber size with mild LVH. There is moderately severe global hypokinesis  with EF estimated at 30-35%.  The AV is mildly thickened but opens adequately. No AS is present.  The MV is pliable: there is mild MR.  TV is normal; there is mild TR.  The atria are normal in size.  RV is normal.  The pericardium is normal thickness, there is no pericardial effusion.    Nuclear stress 06/07/2021: CONCLUSIONS:  1. This study is negative for coronary artery disease.  2. There is a suggestion of diaphragmatic attenuation.  3. Subjectively, LV function is normal but the ejection fraction was calculated by the [facility] software at 49%    Zannie Cove [facility] Short Stay Center/Anesthesiology Phone [phone] 10/06/2022 10:47 AM

## 2022-10-06 NOTE — Anesthesia Preprocedure Evaluation (Addendum)
Anesthesia Evaluation  Patient identified by MRN, date of birth, ID band Patient awake    Reviewed: Allergy & Precautions, NPO status , Patient's Chart, lab work & pertinent test results  History of Anesthesia Complications Negative for: history of anesthetic complications  Airway Mallampati: III  TM Distance: <3 FB Neck ROM: Full    Dental  (+) Teeth Intact, Dental Advisory Given   Pulmonary neg shortness of breath, neg sleep apnea, neg COPD, neg recent URI, former smoker   breath sounds clear to auscultation       Cardiovascular hypertension, Pt. on medications (-) angina +CHF  (-) Past MI  Rhythm:Regular     Neuro/Psych    GI/Hepatic negative GI ROS, Neg liver ROS,,,  Endo/Other    Renal/GU Lab Results      Component                Value               Date                      CREATININE               1.11                10/01/2022                Musculoskeletal  (+) Arthritis ,    Abdominal   Peds  Hematology Lab Results      Component                Value               Date                      WBC                      8.7                 10/01/2022                HGB                      15.0                10/01/2022                HCT                      45.3                10/01/2022                MCV                      89.3                10/01/2022                PLT                      248                 10/01/2022              Anesthesia Other Findings Follows with cardiology at [facility] for history of left bundle branch block, cardiomyopathy with LVEF  of 30 to 35%, HTN.  Currently maintained on carvedilol and Entresto.  He was seen on 10/02/2022 for preop evaluation.  Per note, "Patient may proceed with planned surgery. His perioperative risk of myocardial infarction or cardiac arrest is 1.2% per the geriatric sensitive operative cardiac risk index table. Judicious management of fluids given his  reduced LV ejection fraction strongly advised. He will continue GDMT with carvedilol and Entresto. Return to clinic in 3 months to reassess left ventricular function and further work up and discuss consideration of CRT therapy ."   Patient recently underwent C4-C7 ACDF on 08/15/2022 without complication.   Preop labs reviewed, WNL   EKG 08/08/2022: NSR.  Rate 83.  Possible LAE.  LAD.  Left ventricular hypertrophy with QRS widening and repolarization abnormality.  Cannot rule out septal infarct, age undetermined.   TTE 06/26/2021 (Care Everywhere): Adequate 2D, M mode, and color flow doppler echo demonstrates normal LV  chamber size with mild LVH. There is moderately severe global hypokinesis  with EF estimated at 30-35%.  The AV is mildly thickened but opens adequately. No AS is present.  The MV is pliable: there is mild MR.  TV is normal; there is mild TR.  The atria are normal in size.  RV is normal.  The pericardium is normal thickness, there is no pericardial effusion.    Nuclear stress 06/07/2021: CONCLUSIONS:  1. This study is negative for coronary artery disease.  2. There is a suggestion of diaphragmatic attenuation.  3. Subjectively, LV function is normal but the ejection fraction was calculated by the [facility] software at 49%    Reproductive/Obstetrics                              Anesthesia Physical Anesthesia Plan  ASA: 3  Anesthesia Plan: General   Post-op Pain Management:    Induction: Intravenous  PONV Risk Score and Plan: 2 and Ondansetron and Dexamethasone  Airway Management Planned: Oral ETT  Additional Equipment: Arterial line  Intra-op Plan:   Post-operative Plan: Extubation in OR  Informed Consent: I have reviewed the patients History and Physical, chart, labs and discussed the procedure including the risks, benefits and alternatives for the proposed anesthesia with the patient or authorized representative who has indicated  his/her understanding and acceptance.     Dental advisory given  Plan Discussed with: CRNA  Anesthesia Plan Comments: (PAT note by Antionette Poles, PA-C: Follows with cardiology at [facility] for history of left bundle branch block, cardiomyopathy with LVEF of 30 to 35%, HTN.  Currently maintained on carvedilol and Entresto.  He was seen on 10/02/2022 for preop evaluation.  Per note, "Patient may proceed with planned surgery. His perioperative risk of myocardial infarction or cardiac arrest is 1.2% per the geriatric sensitive operative cardiac risk index table. Judicious management of fluids given his reduced LV ejection fraction strongly advised. He will continue GDMT with carvedilol and Entresto. Return to clinic in 3 months to reassess left ventricular function and further work up and discuss consideration of CRT therapy ."  Patient recently underwent C4-C7 ACDF on 08/15/2022 without complication.  Preop labs reviewed, WNL  EKG 08/08/2022: NSR.  Rate 83.  Possible LAE.  LAD.  Left ventricular hypertrophy with QRS widening and repolarization abnormality.  Cannot rule out septal infarct, age undetermined.   TTE 06/26/2021 (Care Everywhere): Adequate 2D, M mode, and color flow doppler echo demonstrates normal LV  chamber size with mild LVH. There  is moderately severe global hypokinesis  with EF estimated at 30-35%.  The AV is mildly thickened but opens adequately. No AS is present.  The MV is pliable: there is mild MR.  TV is normal; there is mild TR.  The atria are normal in size.  RV is normal.  The pericardium is normal thickness, there is no pericardial effusion.    Nuclear stress 06/07/2021: CONCLUSIONS:  1. This study is negative for coronary artery disease.  2. There is a suggestion of diaphragmatic attenuation.  3. Subjectively, LV function is normal but the ejection fraction was calculated by the [facility] software at 49%   )         Anesthesia Quick Evaluation

## 2022-10-10 ENCOUNTER — Encounter (HOSPITAL_COMMUNITY)
Admission: RE | Disposition: A | Discharge status: Home / Self Care | Payer: Self-pay | Source: Home / Self Care | Attending: Neurological Surgery

## 2022-10-10 ENCOUNTER — Inpatient Hospital Stay (HOSPITAL_COMMUNITY): Payer: Medicare Other | Admitting: Physician Assistant

## 2022-10-10 ENCOUNTER — Inpatient Hospital Stay (HOSPITAL_COMMUNITY): Payer: Medicare Other | Admitting: Registered Nurse

## 2022-10-10 ENCOUNTER — Inpatient Hospital Stay (HOSPITAL_COMMUNITY)
Admission: RE | Admit: 2022-10-10 | Discharge: 2022-10-11 | DRG: 454 | Disposition: A | Discharge status: Home / Self Care | Payer: Medicare Other | Attending: Neurological Surgery | Admitting: Neurological Surgery

## 2022-10-10 ENCOUNTER — Other Ambulatory Visit: Payer: Self-pay

## 2022-10-10 ENCOUNTER — Inpatient Hospital Stay (HOSPITAL_COMMUNITY): Payer: Medicare Other

## 2022-10-10 ENCOUNTER — Encounter (HOSPITAL_COMMUNITY): Payer: Self-pay | Admitting: Neurological Surgery

## 2022-10-10 DIAGNOSIS — I110 Hypertensive heart disease with heart failure: Secondary | ICD-10-CM

## 2022-10-10 DIAGNOSIS — M4316 Spondylolisthesis, lumbar region: Secondary | ICD-10-CM

## 2022-10-10 DIAGNOSIS — Z981 Arthrodesis status: Principal | ICD-10-CM

## 2022-10-10 DIAGNOSIS — I5022 Chronic systolic (congestive) heart failure: Secondary | ICD-10-CM | POA: Diagnosis present

## 2022-10-10 DIAGNOSIS — M62838 Other muscle spasm: Secondary | ICD-10-CM | POA: Diagnosis present

## 2022-10-10 DIAGNOSIS — Z87891 Personal history of nicotine dependence: Secondary | ICD-10-CM

## 2022-10-10 DIAGNOSIS — M48061 Spinal stenosis, lumbar region without neurogenic claudication: Secondary | ICD-10-CM | POA: Diagnosis present

## 2022-10-10 DIAGNOSIS — I509 Heart failure, unspecified: Secondary | ICD-10-CM

## 2022-10-10 DIAGNOSIS — Z87442 Personal history of urinary calculi: Secondary | ICD-10-CM

## 2022-10-10 DIAGNOSIS — Z79899 Other long term (current) drug therapy: Secondary | ICD-10-CM

## 2022-10-10 HISTORY — PX: LUMBAR LAMINECTOMY/DECOMPRESSION MICRODISCECTOMY: SHX5026

## 2022-10-10 SURGERY — POSTERIOR LUMBAR FUSION 1 LEVEL
Anesthesia: General | Site: Back

## 2022-10-10 MED ORDER — PHENYLEPHRINE HCL-NACL 20-0.9 MG/250ML-% IV SOLN
INTRAVENOUS | Status: DC | PRN
Start: 2022-10-10 — End: 2022-10-10
  Administered 2022-10-10: 30 ug/min via INTRAVENOUS

## 2022-10-10 MED ORDER — OXYCODONE HCL 5 MG PO TABS
10.0000 mg | ORAL_TABLET | ORAL | Status: DC | PRN
  Administered 2022-10-10: 5 mg via ORAL
  Filled 2022-10-10 (×2): qty 2

## 2022-10-10 MED ORDER — ACETAMINOPHEN 650 MG RE SUPP: 650.0000 mg | RECTAL | Status: DC | PRN

## 2022-10-10 MED ORDER — FENTANYL CITRATE (PF) 100 MCG/2ML IJ SOLN
INTRAMUSCULAR | Status: AC
  Administered 2022-10-10: 25 ug via INTRAVENOUS
  Filled 2022-10-10: qty 2

## 2022-10-10 MED ORDER — EPHEDRINE SULFATE (PRESSORS) 50 MG/ML IJ SOLN
INTRAMUSCULAR | Status: DC | PRN
  Administered 2022-10-10 (×2): 5 mg via INTRAVENOUS

## 2022-10-10 MED ORDER — PROPOFOL 10 MG/ML IV BOLUS
INTRAVENOUS | Status: DC | PRN
Start: 2022-10-10 — End: 2022-10-10
  Administered 2022-10-10: 100 mg via INTRAVENOUS
  Administered 2022-10-10: 50 mg via INTRAVENOUS
  Administered 2022-10-10: 20 mg via INTRAVENOUS

## 2022-10-10 MED ORDER — OXYCODONE HCL 5 MG/5ML PO SOLN: 5.0000 mg | Freq: Once | ORAL | Status: AC | PRN

## 2022-10-10 MED ORDER — ACETAMINOPHEN 160 MG/5ML PO SOLN: 1000.0000 mg | Freq: Once | ORAL | Status: DC | PRN

## 2022-10-10 MED ORDER — SACUBITRIL-VALSARTAN 24-26 MG PO TABS
1.0000 | ORAL_TABLET | Freq: Two times a day (BID) | ORAL | Status: DC
  Administered 2022-10-10 – 2022-10-11 (×2): 1 via ORAL
  Filled 2022-10-10 (×2): qty 1

## 2022-10-10 MED ORDER — DEXAMETHASONE SODIUM PHOSPHATE 4 MG/ML IJ SOLN: 4.0000 mg | Freq: Four times a day (QID) | INTRAMUSCULAR | Status: DC

## 2022-10-10 MED ORDER — SODIUM CHLORIDE 0.9 % IV SOLN
250.0000 mL | INTRAVENOUS | Status: DC
  Administered 2022-10-10: 250 mL via INTRAVENOUS

## 2022-10-10 MED ORDER — SENNA 8.6 MG PO TABS
1.0000 | ORAL_TABLET | Freq: Two times a day (BID) | ORAL | Status: DC
  Administered 2022-10-10 – 2022-10-11 (×2): 8.6 mg via ORAL
  Filled 2022-10-10 (×2): qty 1

## 2022-10-10 MED ORDER — PHENYLEPHRINE 80 MCG/ML (10ML) SYRINGE FOR IV PUSH (FOR BLOOD PRESSURE SUPPORT)
PREFILLED_SYRINGE | INTRAVENOUS | Status: AC
  Filled 2022-10-10: qty 10

## 2022-10-10 MED ORDER — BUPIVACAINE HCL (PF) 0.25 % IJ SOLN
INTRAMUSCULAR | Status: DC | PRN
  Administered 2022-10-10: 10 mL

## 2022-10-10 MED ORDER — MORPHINE SULFATE (PF) 2 MG/ML IV SOLN
2.0000 mg | INTRAVENOUS | Status: DC | PRN
  Administered 2022-10-10: 2 mg via INTRAVENOUS
  Filled 2022-10-10: qty 1

## 2022-10-10 MED ORDER — THROMBIN 5000 UNITS EX SOLR
CUTANEOUS | Status: AC
  Filled 2022-10-10: qty 5000

## 2022-10-10 MED ORDER — VASOPRESSIN 20 UNIT/ML IV SOLN
INTRAVENOUS | Status: AC
  Filled 2022-10-10: qty 1

## 2022-10-10 MED ORDER — CHLORHEXIDINE GLUCONATE CLOTH 2 % EX PADS: 6.0000 | MEDICATED_PAD | Freq: Once | CUTANEOUS | Status: DC

## 2022-10-10 MED ORDER — DEXAMETHASONE 4 MG PO TABS
4.0000 mg | ORAL_TABLET | Freq: Four times a day (QID) | ORAL | Status: DC
  Administered 2022-10-10 – 2022-10-11 (×3): 4 mg via ORAL
  Filled 2022-10-10 (×3): qty 1

## 2022-10-10 MED ORDER — DEXAMETHASONE SODIUM PHOSPHATE 10 MG/ML IJ SOLN
INTRAMUSCULAR | Status: AC
  Filled 2022-10-10: qty 1

## 2022-10-10 MED ORDER — METHOCARBAMOL 500 MG PO TABS: 500.0000 mg | ORAL_TABLET | Freq: Four times a day (QID) | ORAL | 1 refills | Status: DC | PRN

## 2022-10-10 MED ORDER — LACTATED RINGERS IV SOLN: INTRAVENOUS | Status: DC

## 2022-10-10 MED ORDER — METHOCARBAMOL 500 MG PO TABS
ORAL_TABLET | ORAL | Status: AC
  Filled 2022-10-10: qty 1

## 2022-10-10 MED ORDER — PROPOFOL 10 MG/ML IV BOLUS
INTRAVENOUS | Status: AC
  Filled 2022-10-10: qty 20

## 2022-10-10 MED ORDER — METHOCARBAMOL 500 MG PO TABS
500.0000 mg | ORAL_TABLET | Freq: Four times a day (QID) | ORAL | Status: DC | PRN
  Administered 2022-10-10: 500 mg via ORAL
  Filled 2022-10-10 (×2): qty 1

## 2022-10-10 MED ORDER — THROMBIN 20000 UNITS EX SOLR
CUTANEOUS | Status: AC
  Filled 2022-10-10: qty 20000

## 2022-10-10 MED ORDER — BUPIVACAINE HCL (PF) 0.25 % IJ SOLN
INTRAMUSCULAR | Status: AC
  Filled 2022-10-10: qty 30

## 2022-10-10 MED ORDER — METHOCARBAMOL 1000 MG/10ML IJ SOLN: 500.0000 mg | Freq: Four times a day (QID) | INTRAVENOUS | Status: DC | PRN

## 2022-10-10 MED ORDER — CELECOXIB 200 MG PO CAPS
200.0000 mg | ORAL_CAPSULE | Freq: Two times a day (BID) | ORAL | Status: DC
  Administered 2022-10-10 – 2022-10-11 (×2): 200 mg via ORAL
  Filled 2022-10-10 (×2): qty 1

## 2022-10-10 MED ORDER — PHENOL 1.4 % MT LIQD: 1.0000 | OROMUCOSAL | Status: DC | PRN

## 2022-10-10 MED ORDER — SURGIRINSE WOUND IRRIGATION SYSTEM - OPTIME
TOPICAL | Status: DC | PRN
  Administered 2022-10-10: 450 mL via TOPICAL

## 2022-10-10 MED ORDER — CEFAZOLIN SODIUM-DEXTROSE 2-4 GM/100ML-% IV SOLN
2.0000 g | Freq: Three times a day (TID) | INTRAVENOUS | Status: AC
  Administered 2022-10-10 – 2022-10-11 (×2): 2 g via INTRAVENOUS
  Filled 2022-10-10 (×2): qty 100

## 2022-10-10 MED ORDER — EPHEDRINE SULFATE-NACL 50-0.9 MG/10ML-% IV SOSY
PREFILLED_SYRINGE | INTRAVENOUS | Status: DC | PRN
  Administered 2022-10-10: 5 mg via INTRAVENOUS

## 2022-10-10 MED ORDER — ROCURONIUM BROMIDE 10 MG/ML (PF) SYRINGE
PREFILLED_SYRINGE | INTRAVENOUS | Status: DC | PRN
  Administered 2022-10-10 (×2): 20 mg via INTRAVENOUS
  Administered 2022-10-10: 40 mg via INTRAVENOUS
  Administered 2022-10-10: 30 mg via INTRAVENOUS
  Administered 2022-10-10: 60 mg via INTRAVENOUS

## 2022-10-10 MED ORDER — ACETAMINOPHEN 500 MG PO TABS
1000.0000 mg | ORAL_TABLET | Freq: Four times a day (QID) | ORAL | Status: DC
  Administered 2022-10-10 – 2022-10-11 (×3): 1000 mg via ORAL
  Filled 2022-10-10 (×3): qty 2

## 2022-10-10 MED ORDER — ACETAMINOPHEN 325 MG PO TABS: 650.0000 mg | ORAL_TABLET | ORAL | Status: DC | PRN

## 2022-10-10 MED ORDER — CEFAZOLIN SODIUM-DEXTROSE 2-4 GM/100ML-% IV SOLN
2.0000 g | INTRAVENOUS | Status: AC
  Administered 2022-10-10: 2 g via INTRAVENOUS
  Filled 2022-10-10: qty 100

## 2022-10-10 MED ORDER — SUGAMMADEX SODIUM 200 MG/2ML IV SOLN
INTRAVENOUS | Status: DC | PRN
  Administered 2022-10-10: 200 mg via INTRAVENOUS

## 2022-10-10 MED ORDER — OXYCODONE HCL 5 MG PO TABS
ORAL_TABLET | ORAL | Status: AC
  Filled 2022-10-10: qty 1

## 2022-10-10 MED ORDER — ROCURONIUM BROMIDE 10 MG/ML (PF) SYRINGE
PREFILLED_SYRINGE | INTRAVENOUS | Status: AC
  Filled 2022-10-10: qty 10

## 2022-10-10 MED ORDER — SODIUM CHLORIDE 0.9% FLUSH: 3.0000 mL | INTRAVENOUS | Status: DC | PRN

## 2022-10-10 MED ORDER — OXYCODONE HCL 5 MG PO TABS: 5.0000 mg | ORAL_TABLET | ORAL | 0 refills | Status: DC | PRN

## 2022-10-10 MED ORDER — OXYCODONE HCL 5 MG PO TABS
5.0000 mg | ORAL_TABLET | Freq: Once | ORAL | Status: AC | PRN
  Administered 2022-10-10: 5 mg via ORAL

## 2022-10-10 MED ORDER — GLYCOPYRROLATE PF 0.2 MG/ML IJ SOSY
PREFILLED_SYRINGE | INTRAMUSCULAR | Status: AC
  Filled 2022-10-10: qty 1

## 2022-10-10 MED ORDER — CARVEDILOL 3.125 MG PO TABS
3.1250 mg | ORAL_TABLET | Freq: Two times a day (BID) | ORAL | Status: DC
  Administered 2022-10-10 – 2022-10-11 (×2): 3.125 mg via ORAL
  Filled 2022-10-10 (×2): qty 1

## 2022-10-10 MED ORDER — CHLORHEXIDINE GLUCONATE 0.12 % MT SOLN
15.0000 mL | Freq: Once | OROMUCOSAL | Status: AC
  Administered 2022-10-10: 15 mL via OROMUCOSAL
  Filled 2022-10-10: qty 15

## 2022-10-10 MED ORDER — LIDOCAINE 2% (20 MG/ML) 5 ML SYRINGE
INTRAMUSCULAR | Status: AC
  Filled 2022-10-10: qty 5

## 2022-10-10 MED ORDER — SUCCINYLCHOLINE CHLORIDE 200 MG/10ML IV SOSY
PREFILLED_SYRINGE | INTRAVENOUS | Status: AC
  Filled 2022-10-10: qty 10

## 2022-10-10 MED ORDER — FENTANYL CITRATE (PF) 250 MCG/5ML IJ SOLN
INTRAMUSCULAR | Status: DC | PRN
  Administered 2022-10-10: 50 ug via INTRAVENOUS
  Administered 2022-10-10: 100 ug via INTRAVENOUS

## 2022-10-10 MED ORDER — DEXAMETHASONE SODIUM PHOSPHATE 10 MG/ML IJ SOLN
INTRAMUSCULAR | Status: DC | PRN
  Administered 2022-10-10: 10 mg via INTRAVENOUS

## 2022-10-10 MED ORDER — SODIUM CHLORIDE 0.9% FLUSH
3.0000 mL | Freq: Two times a day (BID) | INTRAVENOUS | Status: DC
  Administered 2022-10-10: 3 mL via INTRAVENOUS

## 2022-10-10 MED ORDER — FENTANYL CITRATE (PF) 250 MCG/5ML IJ SOLN
INTRAMUSCULAR | Status: AC
  Filled 2022-10-10: qty 5

## 2022-10-10 MED ORDER — 0.9 % SODIUM CHLORIDE (POUR BTL) OPTIME
TOPICAL | Status: DC | PRN
  Administered 2022-10-10: 1000 mL

## 2022-10-10 MED ORDER — ONDANSETRON HCL 4 MG/2ML IJ SOLN
INTRAMUSCULAR | Status: AC
  Filled 2022-10-10: qty 2

## 2022-10-10 MED ORDER — VASOPRESSIN 20 UNIT/ML IV SOLN
INTRAVENOUS | Status: DC | PRN
Start: 2022-10-10 — End: 2022-10-10
  Administered 2022-10-10: 4 [IU] via INTRAVENOUS

## 2022-10-10 MED ORDER — ACETAMINOPHEN 500 MG PO TABS
1000.0000 mg | ORAL_TABLET | ORAL | Status: AC
  Administered 2022-10-10: 1000 mg via ORAL
  Filled 2022-10-10: qty 2

## 2022-10-10 MED ORDER — GABAPENTIN 300 MG PO CAPS
300.0000 mg | ORAL_CAPSULE | ORAL | Status: AC
  Administered 2022-10-10: 300 mg via ORAL
  Filled 2022-10-10: qty 1

## 2022-10-10 MED ORDER — FENTANYL CITRATE (PF) 100 MCG/2ML IJ SOLN: 25.0000 ug | INTRAMUSCULAR | Status: DC | PRN

## 2022-10-10 MED ORDER — ACETAMINOPHEN 500 MG PO TABS: 1000.0000 mg | ORAL_TABLET | Freq: Once | ORAL | Status: DC | PRN

## 2022-10-10 MED ORDER — POTASSIUM CHLORIDE IN NACL 20-0.9 MEQ/L-% IV SOLN: INTRAVENOUS | Status: DC

## 2022-10-10 MED ORDER — THROMBIN 5000 UNITS EX SOLR: OROMUCOSAL | Status: DC | PRN

## 2022-10-10 MED ORDER — ACETAMINOPHEN 10 MG/ML IV SOLN: 1000.0000 mg | Freq: Once | INTRAVENOUS | Status: DC | PRN

## 2022-10-10 MED ORDER — LIDOCAINE 2% (20 MG/ML) 5 ML SYRINGE
INTRAMUSCULAR | Status: DC | PRN
  Administered 2022-10-10: 60 mg via INTRAVENOUS

## 2022-10-10 MED ORDER — ORAL CARE MOUTH RINSE: 15.0000 mL | Freq: Once | OROMUCOSAL | Status: AC

## 2022-10-10 MED ORDER — MENTHOL 3 MG MT LOZG: 1.0000 | LOZENGE | OROMUCOSAL | Status: DC | PRN

## 2022-10-10 MED ORDER — THROMBIN 20000 UNITS EX SOLR: CUTANEOUS | Status: DC | PRN

## 2022-10-10 MED ORDER — ONDANSETRON HCL 4 MG/2ML IJ SOLN: 4.0000 mg | Freq: Four times a day (QID) | INTRAMUSCULAR | Status: DC | PRN

## 2022-10-10 MED ORDER — ONDANSETRON HCL 4 MG PO TABS: 4.0000 mg | ORAL_TABLET | Freq: Four times a day (QID) | ORAL | Status: DC | PRN

## 2022-10-10 SURGICAL SUPPLY — 69 items
ADH SKN CLS APL DERMABOND .7 (GAUZE/BANDAGES/DRESSINGS) ×1
APL SKNCLS STERI-STRIP NONHPOA (GAUZE/BANDAGES/DRESSINGS) ×1
BAG COUNTER SPONGE SURGICOUNT (BAG) ×2 IMPLANT
BAG SPNG CNTER NS LX DISP (BAG) ×2
BASKET BONE COLLECTION (BASKET) ×1 IMPLANT
BENZOIN TINCTURE PRP APPL 2/3 (GAUZE/BANDAGES/DRESSINGS) ×2 IMPLANT
BLADE BONE MILL MEDIUM (MISCELLANEOUS) ×1 IMPLANT
BLADE CLIPPER SURG (BLADE) IMPLANT
BONE FIBERS PLIAFX 10 (Bone Implant) ×1 IMPLANT
BUR CARBIDE MATCH 3.0 (BURR) ×2 IMPLANT
CANISTER SUCT 3000ML PPV (MISCELLANEOUS) ×2 IMPLANT
CNTNR URN SCR LID CUP LEK RST (MISCELLANEOUS) ×1 IMPLANT
CONT SPEC 4OZ STRL OR WHT (MISCELLANEOUS) ×1
COVER BACK TABLE 60X90IN (DRAPES) ×1 IMPLANT
DERMABOND ADVANCED .7 DNX12 (GAUZE/BANDAGES/DRESSINGS) ×1 IMPLANT
DRAPE C-ARM 42X72 X-RAY (DRAPES) ×2 IMPLANT
DRAPE C-ARMOR (DRAPES) ×1 IMPLANT
DRAPE LAPAROTOMY 100X72X124 (DRAPES) ×2 IMPLANT
DRAPE MICROSCOPE SLANT 54X150 (MISCELLANEOUS) ×1 IMPLANT
DRAPE SURG 17X23 STRL (DRAPES) ×2 IMPLANT
DRSG OPSITE POSTOP 4X6 (GAUZE/BANDAGES/DRESSINGS) IMPLANT
DURAPREP 26ML APPLICATOR (WOUND CARE) ×2 IMPLANT
ELECT REM PT RETURN 9FT ADLT (ELECTROSURGICAL) ×1
ELECTRODE REM PT RTRN 9FT ADLT (ELECTROSURGICAL) ×2 IMPLANT
EVACUATOR 1/8 PVC DRAIN (DRAIN) ×1 IMPLANT
GAUZE 4X4 16PLY ~~LOC~~+RFID DBL (SPONGE) IMPLANT
GLOVE BIO SURGEON STRL SZ7 (GLOVE) IMPLANT
GLOVE BIO SURGEON STRL SZ8 (GLOVE) ×3 IMPLANT
GLOVE BIOGEL PI IND STRL 7.0 (GLOVE) IMPLANT
GOWN STRL REUS W/ TWL LRG LVL3 (GOWN DISPOSABLE) IMPLANT
GOWN STRL REUS W/ TWL XL LVL3 (GOWN DISPOSABLE) ×3 IMPLANT
GOWN STRL REUS W/TWL 2XL LVL3 (GOWN DISPOSABLE) IMPLANT
GOWN STRL REUS W/TWL LRG LVL3 (GOWN DISPOSABLE) ×2
GOWN STRL REUS W/TWL XL LVL3 (GOWN DISPOSABLE) ×4
GRAFT BNE FBR PLIAFX PRIME 10 (Bone Implant) IMPLANT
HEMOSTAT POWDER KIT SURGIFOAM (HEMOSTASIS) ×2 IMPLANT
KIT BASIN OR (CUSTOM PROCEDURE TRAY) ×2 IMPLANT
KIT GRAFTMAG DEL NEURO DISP (NEUROSURGERY SUPPLIES) IMPLANT
KIT POSITION SURG JACKSON T1 (MISCELLANEOUS) ×1 IMPLANT
KIT TURNOVER KIT B (KITS) ×2 IMPLANT
MILL BONE PREP (MISCELLANEOUS) ×1 IMPLANT
NDL HYPO 25X1 1.5 SAFETY (NEEDLE) ×2 IMPLANT
NDL SPNL 20GX3.5 QUINCKE YW (NEEDLE) IMPLANT
NEEDLE HYPO 25X1 1.5 SAFETY (NEEDLE) ×1 IMPLANT
NEEDLE SPNL 20GX3.5 QUINCKE YW (NEEDLE) IMPLANT
NS IRRIG 1000ML POUR BTL (IV SOLUTION) ×2 IMPLANT
PACK LAMINECTOMY NEURO (CUSTOM PROCEDURE TRAY) ×2 IMPLANT
PAD ARMBOARD 7.5X6 YLW CONV (MISCELLANEOUS) ×6 IMPLANT
PUTTY DBM 5CC (Putty) IMPLANT
ROD LORD LIPPED TI 5.5X35 (Rod) IMPLANT
SCREW CORT SHANK MOD 6.5X40 (Screw) IMPLANT
SCREW KODIAK 6.5X40 (Screw) IMPLANT
SCREW POLYAXIAL TULIP (Screw) IMPLANT
SET SCREW (Screw) ×4 IMPLANT
SET SCREW SPNE (Screw) IMPLANT
SOLUTION IRRIG SURGIPHOR (IV SOLUTION) ×1 IMPLANT
SPACER IDENTITI 9X7X25 5D (Spacer) IMPLANT
SPONGE SURGIFOAM ABS GEL 100 (HEMOSTASIS) ×1 IMPLANT
SPONGE SURGIFOAM ABS GEL SZ50 (HEMOSTASIS) ×1 IMPLANT
SPONGE T-LAP 4X18 ~~LOC~~+RFID (SPONGE) IMPLANT
STRIP CLOSURE SKIN 1/2X4 (GAUZE/BANDAGES/DRESSINGS) ×3 IMPLANT
SUT VIC AB 0 CT1 18XCR BRD8 (SUTURE) ×2 IMPLANT
SUT VIC AB 0 CT1 8-18 (SUTURE) ×2
SUT VIC AB 2-0 CP2 18 (SUTURE) ×2 IMPLANT
SUT VIC AB 3-0 SH 8-18 (SUTURE) ×3 IMPLANT
TOWEL GREEN STERILE (TOWEL DISPOSABLE) ×2 IMPLANT
TOWEL GREEN STERILE FF (TOWEL DISPOSABLE) ×2 IMPLANT
TRAY FOLEY MTR SLVR 16FR STAT (SET/KITS/TRAYS/PACK) ×1 IMPLANT
WATER STERILE IRR 1000ML POUR (IV SOLUTION) ×2 IMPLANT

## 2022-10-10 NOTE — Anesthesia Procedure Notes (Signed)
Arterial Line Insertion Start/End8/04/2022 11:00 AM, 10/10/2022 11:02 AM Performed by: Val Eagle, MD, Marquis Buggy, CRNA, CRNA  Patient location: OR. Preanesthetic checklist: patient identified, IV checked, site marked, risks and benefits discussed, surgical consent, monitors and equipment checked, pre-op evaluation, timeout performed and anesthesia consent Lidocaine 1% used for infiltration Right, radial was placed Catheter size: 20 G Hand hygiene performed  and maximum sterile barriers used   Attempts: 1 Procedure performed without using ultrasound guided technique. Following insertion, dressing applied and Biopatch. Post procedure assessment: normal  Patient tolerated the procedure well with no immediate complications.

## 2022-10-10 NOTE — H&P (Signed)
Subjective: Patient is a 77 y.o. male admitted for back and leg pain. Onset of symptoms was several months ago, gradually worsening since that time.  The pain is rated severe, and is located at the across the lower back and radiates to legs L>R. The pain is described as aching and occurs all day. The symptoms have been progressive. Symptoms are exacerbated by exercise, standing, and walking for more than a few minutes. MRI or CT showed spondylolisthesis L4-5 with stenosis L2-3 L3-4 L4-5   Past Medical History:  Diagnosis Date   Arthritis    Heart murmur    pt has had an echocardiogram 06/26/21   HFrEF (heart failure with reduced ejection fraction) (HCC)    History of kidney stones    White coat syndrome with diagnosis of hypertension     Past Surgical History:  Procedure Laterality Date   ANKLE FRACTURE SURGERY Right    ANTERIOR CERVICAL DECOMP/DISCECTOMY FUSION N/A 08/15/2022   Procedure: Anterior Cervical Decompression Fusion  - Cervical four-Cervical five - Cervical five-Cervical six - Cervical six-Cervical seven;  Surgeon: Arman Bogus, MD;  Location: [facility] OR;  Service: Neurosurgery;  Laterality: N/A;   KIDNEY SURGERY Left    TONSILLECTOMY      Prior to Admission medications   Medication Sig Start Date End Date Taking? Authorizing Provider  carvedilol (COREG) 3.125 MG tablet Take 3.125 mg by mouth 2 (two) times daily with a meal. 08/08/22  Yes [provider]  sacubitril-valsartan (ENTRESTO) 24-26 MG Take 1 tablet by mouth 2 (two) times daily. 08/08/22  Yes [provider]  HYDROcodone-acetaminophen (NORCO) 7.5-325 MG tablet Take 1 tablet by mouth every 4 (four) hours as needed for moderate pain ((score 4 to 6)). Patient not taking: Reported on 09/25/2022 08/16/22   Dawley, Kendell Bane C, DO  methocarbamol (ROBAXIN) 500 MG tablet Take 1 tablet (500 mg total) by mouth every 6 (six) hours as needed for muscle spasms. Patient not taking: Reported on 09/25/2022 08/16/22   Dawley, Troy  C, DO   No Known Allergies  Social History   Tobacco Use   Smoking status: Former    Types: Cigarettes    Start date: 1979    Quit date: 1966    Years since quitting: 58.6   Smokeless tobacco: Never  Substance Use Topics   Alcohol use: Not Currently    History reviewed. No pertinent family history.   Review of Systems  Positive ROS: neg  All other systems have been reviewed and were otherwise negative with the exception of those mentioned in the HPI and as above.  Objective: Vital signs in last 24 hours: Temp:  [97.9 F (36.6 C)] 97.9 F (36.6 C) (08/02 0842) Pulse Rate:  [70] 70 (08/02 0842) Resp:  [18] 18 (08/02 0842) BP: (154)/(98) 154/98 (08/02 0842) SpO2:  [99 %] 99 % (08/02 0842) Weight:  [59 kg] 59 kg (08/02 0842)  General Appearance: Alert, cooperative, no distress, appears stated age Head: Normocephalic, without obvious abnormality, atraumatic Eyes: PERRL, conjunctiva/corneas clear, EOM's intact    Neck: Supple, symmetrical, trachea midline Back: Symmetric, no curvature, ROM normal, no CVA tenderness Lungs:  respirations unlabored Heart: Regular rate and rhythm Abdomen: Soft, non-tender Extremities: Extremities normal, atraumatic, no cyanosis or edema Pulses: 2+ and symmetric all extremities Skin: Skin color, texture, turgor normal, no rashes or lesions  NEUROLOGIC:   Mental status: Alert and oriented x4,  no aphasia, good attention span, fund of knowledge, and memory Motor Exam - grossly normal except grip  weakness Sensory Exam - grossly normal Reflexes: 3+ Coordination - grossly normal Gait - spastic Balance - decreased Cranial Nerves: I: smell Not tested  II: visual acuity  OS: nl    OD: nl  II: visual fields Full to confrontation  II: pupils Equal, round, reactive to light  III,VII: ptosis None  III,IV,VI: extraocular muscles  Full ROM  V: mastication Normal  V: facial light touch sensation  Normal  V,VII: corneal reflex  Present  VII:  facial muscle function - upper  Normal  VII: facial muscle function - lower Normal  VIII: hearing Not tested  IX: soft palate elevation  Normal  IX,X: gag reflex Present  XI: trapezius strength  5/5  XI: sternocleidomastoid strength 5/5  XI: neck flexion strength  5/5  XII: tongue strength  Normal    Data Review Lab Results  Component Value Date   WBC 8.7 10/01/2022   HGB 15.0 10/01/2022   HCT 45.3 10/01/2022   MCV 89.3 10/01/2022   PLT 248 10/01/2022   Lab Results  Component Value Date   NA 135 10/01/2022   K 4.5 10/01/2022   CL 101 10/01/2022   CO2 25 10/01/2022   BUN 21 10/01/2022   CREATININE 1.11 10/01/2022   GLUCOSE 98 10/01/2022   Lab Results  Component Value Date   INR 0.9 10/01/2022    Assessment/Plan:  Estimated body mass index is 20.36 kg/m as calculated from the following:   Height as of this encounter: 5\' 7"  (1.702 m).   Weight as of this encounter: 59 kg. Patient admitted for PLIF L4-5, DLL L2-3 L3-4. Patient has failed a reasonable attempt at conservative therapy.  I explained the condition and procedure to the patient and answered any questions.  Patient wishes to proceed with procedure as planned. Understands risks/ benefits and typical outcomes of procedure.   Tia Alert 10/10/2022 9:31 AM

## 2022-10-10 NOTE — Transfer of Care (Signed)
Immediate Anesthesia Transfer of Care Note  Patient: Marco Alvarado  Procedure(s) Performed: Posterior Lumbar Interbody Fusion - Lumbar four-Lumbar five (Back) Laminectomy - Lumbar two-three- Lumbar three-Lumbar four (Back)  Patient Location: PACU  Anesthesia Type:General  Level of Consciousness: awake, alert , and oriented  Airway & Oxygen Therapy: Patient Spontanous Breathing and Patient connected to face mask oxygen  Post-op Assessment: Report given to RN and Post -op Vital signs reviewed and stable  Post vital signs: Reviewed and stable  Last Vitals:  Vitals Value Taken Time  BP 130/80 10/10/22 1409  Temp 37 C 10/10/22 1409  Pulse 83 10/10/22 1409  Resp 14 10/10/22 1409  SpO2 97 % 10/10/22 1409  Vitals shown include unfiled device data.  Last Pain:  Vitals:   10/10/22 1409  TempSrc: Oral  PainSc:          Complications: No notable events documented.

## 2022-10-10 NOTE — Anesthesia Procedure Notes (Signed)
Procedure Name: Intubation Date/Time: 10/10/2022 11:15 AM  Performed by: Marquis Buggy, CRNAPre-anesthesia Checklist: Patient identified, Emergency Drugs available, Suction available, Patient being monitored and Timeout performed Oxygen Delivery Method: Circle system utilized Preoxygenation: Pre-oxygenation with 100% oxygen Induction Type: IV induction Ventilation: Oral airway inserted - appropriate to patient size and Mask ventilation without difficulty Laryngoscope Size: Mac and 4 Grade View: Grade I Tube type: Oral Tube size: 7.5 mm Airway Equipment and Method: Stylet Placement Confirmation: ETT inserted through vocal cords under direct vision, positive ETCO2 and breath sounds checked- equal and bilateral Secured at: 21 cm Tube secured with: Tape Dental Injury: Teeth and Oropharynx as per pre-operative assessment

## 2022-10-10 NOTE — Op Note (Signed)
10/10/2022  1:49 PM  PATIENT:  Marco Alvarado  77 y.o. 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 Decompressive lumbar laminectomy, hemi facetectomy and foraminotomies L4-5 requiring more work than would be required for a simple exposure of the disk for PLIF in order to adequately decompress the neural elements and address the spinal stenosis 2. Posterior lumbar interbody fusion L4-5 using PTI interbody cages packed with morcellized allograft and autograft  3. Posterior fixation L4-5 using ATEC cortical pedicle screws.  4. Intertransverse arthrodesis L3-4 L4-5 right using morcellized autograft and allograft. 5.  Decompressive lumbar hemilaminectomy medial facetectomy foraminotomies L2-3 L3-4 left with sublaminar decompression.  Note these levels are separate from the PLIF level  SURGEON:  Marikay Alar, MD  ASSISTANTS: Verlin Dike, FNP  ANESTHESIA:  General  EBL: 100 ml  Total I/O In: -  Out: 110 [Urine:110]  BLOOD ADMINISTERED:none  DRAINS: none   INDICATION FOR PROCEDURE: This patient presented with back pain with left greater than right leg pain with a feeling of weakness in the legs. Imaging revealed grade 2 spondylolisthesis L4-5 with severe spinal stenosis with at least moderate stenosis at L2-3 and L3-4. The patient tried a reasonable attempt at conservative medical measures without relief. I recommended decompression and instrumented fusion to address the stenosis as well as the segmental  instability.  Patient understood the risks, benefits, and alternatives and potential outcomes and wished to proceed.  PROCEDURE DETAILS:  The patient was brought to the operating room. After induction of generalized endotracheal anesthesia the patient was rolled into the prone position on chest rolls and all pressure points  were padded. The patient's lumbar region was cleaned and then prepped with DuraPrep and draped in the usual sterile fashion. Anesthesia was injected and then a dorsal midline incision was made and carried down to the lumbosacral fascia. The fascia was opened and the paraspinous musculature was taken down in a subperiosteal fashion to expose L2-3 L3-4 and L4-5 bilaterally. A self-retaining retractor was placed. Intraoperative fluoroscopy confirmed my level, and I started with placement of the L4 cortical pedicle screws. The pedicle screw entry zones were identified utilizing surface landmarks and  AP and lateral fluoroscopy. I scored the cortex with the high-speed drill and then used the hand drill to drill an upward and outward direction into the pedicle. I then tapped line to line. I then placed a 6.5 x 40 mm cortical pedicle screw into the pedicles of L4 bilaterally.    I then turned my attention to the decompression and complete lumbar laminectomies, hemi- facetectomies, and foraminotomies were performed at L4-5.  At L2-3 and L3-4 on the left I performed hemilaminectomies, medial facetectomies and foraminotomies and drilled up under the spinous process and opposite lamina to perform a sublaminar decompression at these 2 levels.  My nurse practitioner was directly involved in the decompression and exposure of the neural elements. the patient had significant spinal stenosis and this required more work than would be required for a simple exposure of the disc for posterior lumbar interbody fusion which would only require a limited laminotomy. Much more generous decompression and generous foraminotomy was undertaken in order to adequately decompress the neural elements and address the patient's leg pain. The yellow ligament was removed to expose the underlying dura and nerve roots, and generous foraminotomies were performed to adequately decompress the neural elements. Both  the exiting and traversing nerve roots  were decompressed on both sides until a coronary dilator passed easily along the nerve roots. Once the decompression was complete, I turned my attention to the posterior lower lumbar interbody fusion. The epidural venous vasculature was coagulated and cut sharply. Disc space was incised and the initial discectomy was performed with pituitary rongeurs. The disc space was distracted with sequential distractors to a height of 8 mm. We then used a series of scrapers and shavers to prepare the endplates for fusion. The midline was prepared with Epstein curettes. Once the complete discectomy was finished, we packed an appropriate sized interbody cage with local autograft and morcellized allograft, gently retracted the nerve root, and tapped the cage into position at L4-5 bilaterally.  The midline between the cages was packed with morselized autograft and allograft.   We then turned our attention to the placement of the lower pedicle screws. The pedicle screw entry zones were identified utilizing surface landmarks and fluoroscopy. I drilled into each pedicle utilizing the hand drill, and tapped each pedicle with the appropriate tap. We palpated with a ball probe to assure no break in the cortex. We then placed 6.5 x 40 mm pedicle screws into the pedicles bilaterally at L5.  My nurse practitioner assisted in placement of the pedicle screws.  We then decorticated the transverse processes of L3-L4 and L5 on the right and laid a mixture of morcellized autograft and allograft out over these to perform intertransverse arthrodesis at L3-4 and L4-5. We then placed lordotic rods into the multiaxial screw heads of the pedicle screws and locked these in position with the locking caps and anti-torque device. We then checked our construct with AP and lateral fluoroscopy. Irrigated with copious amounts of 0.5% povidone iodine solution followed by saline solution. Inspected the nerve roots once again to assure adequate decompression,  lined to the dura with Gelfoam,  and then we closed the muscle and the fascia with 0 Vicryl. Closed the subcutaneous tissues with 2-0 Vicryl and subcuticular tissues with 3-0 Vicryl. The skin was closed with benzoin and Steri-Strips. Dressing was then applied, the patient was awakened from general anesthesia and transported to the recovery room in stable condition. At the end of the procedure all sponge, needle and instrument counts were correct.   PLAN OF CARE: admit to inpatient  PATIENT DISPOSITION:  PACU - hemodynamically stable.   Delay start of Pharmacological VTE agent (>24hrs) due to surgical blood loss or risk of bleeding:  yes

## 2022-10-11 NOTE — Discharge Instructions (Signed)
Wound Care Keep incision covered and dry for two days.   Do not put any creams, lotions, or ointments on incision. Leave steri-strips on back.  They will fall off by themselves. Activity Walk each and every day, increasing distance each day. No lifting greater than 5 lbs.  Avoid excessive neck motion. No driving for 2 weeks; may ride as a passenger locally. If provided with back brace, wear when out of bed.  It is not necessary to wear brace in bed. Diet Resume your normal diet.  Return to Work Will be discussed at you follow up appointment. Call Your Doctor If Any of These Occur Redness, drainage, or swelling at the wound.  Temperature greater than 101 degrees. Severe pain not relieved by pain medication. Incision starts to come apart. Follow Up Appt Call to schedule appointment in 1-2 weeks ([phone]) or for problems.  If you have any hardware placed in your spine, you will need an x-ray before your appointment.

## 2022-10-11 NOTE — Evaluation (Signed)
Occupational Therapy Evaluation Patient Details Name: Marco Alvarado MRN: [id number] DOB: 04-21-1945 Today's Date: 10/11/2022   History of Present Illness 77 y.o. male s/p decompressive lumbar laminectomy, hemi-facetectomy, and foraminotomies L4-5, PLIF L 4-5, intertransverse arthrodesis L3-4, and left decompressive lumbar hemilaminectomy medial facetectomy foraminotomies L2-4. PMH significant for recent ACDF with anterior cervical arthrodesis, HFrEF, kidney stones, HTN, white coat syndrome.   Clinical Impression   PTA, pt reports he was mod I in ADL and mobility. Upon eval, pt performing UB ADL with up to min A for brace application and LB ADL with supervision. Pt educated and demonstrating use of compensatory techniques for bed mobility, LB ADL, grooming, toileting, and shower transfers within precautions. All education provided and questions answered. Recommending discharge home with no OT follow up at this time and wife to assist as needed.. OT to sign off. Please re-consult if change in status.         Recommendations for follow up therapy are one component of a multi-disciplinary discharge planning process, led by the attending physician.  Recommendations may be updated based on patient status, additional functional criteria and insurance authorization.   Assistance Recommended at Discharge None  Patient can return home with the following A little help with bathing/dressing/bathroom;Assistance with cooking/housework;Assist for transportation;Help with stairs or ramp for entrance;A little help with walking and/or transfers    Functional Status Assessment  Patient has had a recent decline in their functional status and demonstrates the ability to make significant improvements in function in a reasonable and predictable amount of time.  Equipment Recommendations  None recommended by OT    Recommendations for Other Services       Precautions / Restrictions Precautions Precautions:  Back Restrictions Weight Bearing Restrictions: No      Mobility Bed Mobility Overal bed mobility: Needs Assistance Bed Mobility: Rolling, Sidelying to Sit Rolling: Supervision Sidelying to sit: Supervision       General bed mobility comments: 1-2 cues for technique    Transfers Overall transfer level: Needs assistance Equipment used: Rolling walker (2 wheels) Transfers: Sit to/from Stand Sit to Stand: Supervision           General transfer comment: slow, steady rise      Balance                                           ADL either performed or assessed with clinical judgement   ADL Overall ADL's : Needs assistance/impaired Eating/Feeding: Independent   Grooming: Standing;Set up   Upper Body Bathing: Set up;Sitting   Lower Body Bathing: Supervison/ safety;Sit to/from stand   Upper Body Dressing : Minimal assistance;Standing Upper Body Dressing Details (indicate cue type and reason): for brace application Lower Body Dressing: Supervision/safety;Sit to/from stand   Toilet Transfer: Ambulation;Rolling walker (2 wheels);Supervision/safety Toilet Transfer Details (indicate cue type and reason): min guard A approaching supervision at end of session     Tub/ Shower Transfer: Walk-in shower;Min guard;Ambulation;Rolling walker (2 wheels) Tub/Shower Transfer Details (indicate cue type and reason): a little resistance to new education as pt adamant his way works. Required education regarding potential safety hazards with bringing RW into shower Functional mobility during ADLs: Supervision/safety;Rolling walker (2 wheels)       Vision Baseline Vision/History: 1 Wears glasses Ability to See in Adequate Light: 0 Adequate Patient Visual Report: No change from baseline Vision Assessment?: No apparent visual deficits  Perception Perception Perception Tested?: No   Praxis Praxis Praxis tested?: Not tested    Pertinent Vitals/Pain Pain  Assessment Pain Assessment: 0-10 Pain Score: 3  Pain Location: operative site Pain Descriptors / Indicators: Sore, Operative site guarding Pain Intervention(s): Limited activity within patient's tolerance, Monitored during session     Hand Dominance Right   Extremity/Trunk Assessment Upper Extremity Assessment Upper Extremity Assessment: Generalized weakness;LUE deficits/detail LUE Deficits / Details: 4-/5 grip strength; decreased dexterity   Lower Extremity Assessment Lower Extremity Assessment: Defer to PT evaluation   Cervical / Trunk Assessment Cervical / Trunk Assessment: Back Surgery;Neck Surgery   Communication Communication Communication: No difficulties   Cognition Arousal/Alertness: Awake/alert Behavior During Therapy: WFL for tasks assessed/performed Overall Cognitive Status: Within Functional Limits for tasks assessed                                 General Comments: able to recall spinal precautions after initial education     General Comments  VSS    Exercises     Shoulder Instructions      Home Living Family/patient expects to be discharged to:: Private residence Living Arrangements: Spouse/significant other Available Help at Discharge: Family;Available 24 hours/day Type of Home: House Home Access: Stairs to enter [employer] of Steps: 3 Entrance Stairs-Rails: Right Home Layout: Two level;Able to live on main level with bedroom/bathroom Alternate Level Stairs-Number of Steps: does not use   Bathroom Shower/Tub: Producer, television/film/video: Standard Bathroom Accessibility: Yes   Home Equipment: Shower seat;Grab bars - toilet;Grab bars - tub/shower;Cane - single point;Wheelchair - Forensic psychologist (2 wheels);BSC/3in1          Prior Functioning/Environment Prior Level of Function : Independent/Modified Independent             Mobility Comments: ambulated with SPC ADLs Comments: independent in ADL; wife  assist with home making        OT Problem List: Decreased strength;Impaired balance (sitting and/or standing);Decreased activity tolerance;Decreased knowledge of use of DME or AE;Decreased knowledge of precautions      OT Treatment/Interventions:      OT Goals(Current goals can be found in the care plan section) Acute Rehab OT Goals Patient Stated Goal: go home OT Goal Formulation: With patient  OT Frequency:      Co-evaluation              AM-PAC OT "6 Clicks" Daily Activity     Outcome Measure Help from another person eating meals?: None Help from another person taking care of personal grooming?: A Little Help from another person toileting, which includes using toliet, bedpan, or urinal?: A Little Help from another person bathing (including washing, rinsing, drying)?: A Little Help from another person to put on and taking off regular upper body clothing?: A Little Help from another person to put on and taking off regular lower body clothing?: A Little 6 Click Score: 19   End of Session Equipment Utilized During Treatment: Gait belt;Rolling walker (2 wheels);Back brace Nurse Communication: Mobility status  Activity Tolerance: Patient tolerated treatment well Patient left: in bed;with call bell/phone within reach  OT Visit Diagnosis: Unsteadiness on feet (R26.81);Muscle weakness (generalized) (M62.81)                Time: 6962-9528 OT Time Calculation (min): 15 min Charges:  OT General Charges $OT Visit: 1 Visit OT Evaluation $OT Eval Low Complexity: 1 Low   D  Fransisca Kaufmann, OTR/L [facility] Acute Rehabilitation Office: [phone]   Myrla Halsted 10/11/2022, 8:17 AM

## 2022-10-11 NOTE — Care Management (Signed)
Patient with order to DC to home today. Unit staff to provide DME needed for home.   No HH needs identified Patient will have family/ friends provide transportation home. No other TOC needs identified for DC 

## 2022-10-11 NOTE — Progress Notes (Signed)
Patient is discharged from room 3C05 at this time. Alert and in stable condition. IV site d/c and instructions read to patient and wife with understanding verbalized and all questions answered. Patient left unit via wheelchair with all belongings at side.

## 2022-10-11 NOTE — Evaluation (Signed)
Physical Therapy Evaluation Patient Details Name: Marco Alvarado MRN: [id number] DOB: July 05, 1945 Today's Date: 10/11/2022  History of Present Illness  77 y.o. male s/p decompressive lumbar laminectomy, hemi-facetectomy, and foraminotomies L4-5, PLIF L 4-5, intertransverse arthrodesis L3-4, and left decompressive lumbar hemilaminectomy medial facetectomy foraminotomies L2-4. PMH significant for recent ACDF with anterior cervical arthrodesis, HFrEF, kidney stones, HTN, white coat syndrome.  Clinical Impression  Pt presents to PT with mild deficits in gait, power, but is mobilizing very well s/p back surgery. Pt is able to ambulate with support of RW for household distances and negotiates stairs without physical assistance. Pt is encouraged to mobilize frequently in an effort to improve activity tolerance and independence. PT recommends discharge home when medically appropriate.        If plan is discharge home, recommend the following: A little help with bathing/dressing/bathroom;Assistance with cooking/housework;Assist for transportation;Help with stairs or ramp for entrance   Can travel by private vehicle        Equipment Recommendations None recommended by PT  Recommendations for Other Services       Functional Status Assessment Patient has had a recent decline in their functional status and demonstrates the ability to make significant improvements in function in a reasonable and predictable amount of time.     Precautions / Restrictions Precautions Precautions: Back Precaution Booklet Issued: Yes (comment) Required Braces or Orthoses: Spinal Brace Spinal Brace: Lumbar corset;Applied in standing position (pt dons in standing despite PT cues to don in sitting) Restrictions Weight Bearing Restrictions: No      Mobility  Bed Mobility Overal bed mobility: Modified Independent Bed Mobility: Rolling, Sidelying to Sit, Sit to Sidelying Rolling: Modified independent (Device/Increase  time) Sidelying to sit: Modified independent (Device/Increase time)     Sit to sidelying: Modified independent (Device/Increase time)      Transfers Overall transfer level: Modified independent Equipment used: Rolling walker (2 wheels) Transfers: Sit to/from Stand Sit to Stand: Modified independent (Device/Increase time)                Ambulation/Gait Ambulation/Gait assistance: Supervision Gait Distance (Feet): 200 Feet Assistive device: Rolling walker (2 wheels) Gait Pattern/deviations: Step-through pattern Gait velocity: functional Gait velocity interpretation: >2.62 ft/sec, indicative of community ambulatory   General Gait Details: steady step-through gait  Stairs Stairs: Yes Stairs assistance: Modified independent (Device/Increase time) Stair Management: One rail Right, Step to pattern, Sideways Number of Stairs: 4    Wheelchair Mobility     Tilt Bed    Modified Rankin (Stroke Patients Only)       Balance Overall balance assessment: Needs assistance Sitting-balance support: No upper extremity supported, Feet supported Sitting balance-Leahy Scale: Good     Standing balance support: No upper extremity supported, During functional activity Standing balance-Leahy Scale: Good                               Pertinent Vitals/Pain Pain Assessment Pain Assessment: 0-10 Pain Score: 4  Pain Location: operative site Pain Descriptors / Indicators: Sore, Operative site guarding Pain Intervention(s): Monitored during session    Home Living Family/patient expects to be discharged to:: Private residence Living Arrangements: Spouse/significant other Available Help at Discharge: Family;Available 24 hours/day Type of Home: House Home Access: Stairs to enter Entrance Stairs-Rails: Right Entrance Stairs-Number of Steps: 3 Alternate Level Stairs-Number of Steps: does not use Home Layout: Two level;Able to live on main level with  bedroom/bathroom Home Equipment: Shower seat;Grab bars - toilet;Grab  bars - tub/shower;Cane - single point;Wheelchair - Forensic psychologist (2 wheels);BSC/3in1      Prior Function Prior Level of Function : Independent/Modified Independent             Mobility Comments: ambulated with SPC ADLs Comments: independent in ADL; wife assist with home making     Hand Dominance   Dominant Hand: Right    Extremity/Trunk Assessment   Upper Extremity Assessment Upper Extremity Assessment: Defer to OT evaluation LUE Deficits / Details: 4-/5 grip strength; decreased dexterity    Lower Extremity Assessment Lower Extremity Assessment: Generalized weakness    Cervical / Trunk Assessment Cervical / Trunk Assessment: Back Surgery;Neck Surgery  Communication   Communication: No difficulties  Cognition Arousal/Alertness: Awake/alert Behavior During Therapy: WFL for tasks assessed/performed Overall Cognitive Status: Within Functional Limits for tasks assessed                                 General Comments: able to recall spinal precautions after initial education        General Comments General comments (skin integrity, edema, etc.): VSS on RA    Exercises     Assessment/Plan    PT Assessment Patient does not need any further PT services  PT Problem List         PT Treatment Interventions      PT Goals (Current goals can be found in the Care Plan section)       Frequency       Co-evaluation               AM-PAC PT "6 Clicks" Mobility  Outcome Measure Help needed turning from your back to your side while in a flat bed without using bedrails?: None Help needed moving from lying on your back to sitting on the side of a flat bed without using bedrails?: None Help needed moving to and from a bed to a chair (including a wheelchair)?: None Help needed standing up from a chair using your arms (e.g., wheelchair or bedside chair)?: None Help needed to  walk in hospital room?: A Little Help needed climbing 3-5 steps with a railing? : A Little 6 Click Score: 22    End of Session Equipment Utilized During Treatment: Back brace Activity Tolerance: Patient tolerated treatment well Patient left: in bed;with call bell/phone within reach Nurse Communication: Mobility status PT Visit Diagnosis: Other abnormalities of gait and mobility (R26.89);Pain    Time: 6440-3474 PT Time Calculation (min) (ACUTE ONLY): 11 min   Charges:   PT Evaluation $PT Eval Low Complexity: 1 Low   PT General Charges $$ ACUTE PT VISIT: 1 Visit         Arlyss Gandy, PT, DPT Acute Rehabilitation Office [phone]   Arlyss Gandy 10/11/2022, 9:57 AM

## 2022-10-11 NOTE — Discharge Summary (Signed)
Physician Discharge Summary  Patient ID: Marco Alvarado MRN: [id number] DOB/AGE: 1946-02-13 77 y.o.  Admit date: 10/10/2022 Discharge date: 10/11/2022  Admission Diagnoses:  Discharge Diagnoses:  Principal Problem:   S/P lumbar fusion   Discharged Condition: good  Hospital Course: Patient mid to the hospital where he underwent uncomplicated lumbar decompression and fusion surgery.  Postoperative doing well.  Standing and without difficulty.  Ready for discharge home.  Consults:   Significant Diagnostic Studies:   Treatments:   Discharge Exam: Blood pressure (!) 140/86, pulse 95, temperature 97.8 F (36.6 C), temperature source Oral, resp. rate 18, height 5\' 7"  (1.702 m), weight 59 kg, SpO2 100%. Awake and alert.  Oriented and appropriate.  Motor and sensory function intact.  Wound clean and dry.  Chest and abdomen benign.  Disposition: Discharge disposition: 01-Home or Self Care       Discharge Instructions      Remove dressing in 72 hours   Complete by: As directed    Call MD for:  difficulty breathing, headache or visual disturbances   Complete by: As directed    Call MD for:  persistant nausea and vomiting   Complete by: As directed    Call MD for:  redness, tenderness, or signs of infection (pain, swelling, redness, odor or green/yellow discharge around incision site)   Complete by: As directed    Call MD for:  severe uncontrolled pain   Complete by: As directed    Call MD for:  temperature >100.4   Complete by: As directed    Diet - low sodium heart healthy   Complete by: As directed    Increase activity slowly   Complete by: As directed       Allergies as of 10/11/2022   No Known Allergies      Medication List     STOP taking these medications    HYDROcodone-acetaminophen 7.5-325 MG tablet Commonly known as: NORCO       TAKE these medications    carvedilol 3.125 MG tablet Commonly known as: COREG Take 3.125 mg by mouth 2 (two) times daily  with a meal.   methocarbamol 500 MG tablet Commonly known as: ROBAXIN Take 1 tablet (500 mg total) by mouth every 6 (six) hours as needed for muscle spasms.   oxyCODONE 5 MG immediate release tablet Commonly known as: Oxy IR/ROXICODONE Take 1 tablet (5 mg total) by mouth every 4 (four) hours as needed for severe pain ((score 7 to 10)).   sacubitril-valsartan 24-26 MG Commonly known as: ENTRESTO Take 1 tablet by mouth 2 (two) times daily.               Durable Medical Equipment  (From admission, onward)           Start     Ordered   10/10/22 1703  DME Walker rolling  Once       Question:  Patient needs a walker to treat with the following condition  Answer:  S/P lumbar fusion   10/10/22 1702   10/10/22 1703  DME 3 n 1  Once        10/10/22 1702            Follow-up Information     Arman Bogus, MD. Call.   Specialty: Neurosurgery Why: As needed, If symptoms worsen Contact information: [address] [place] [address] [phone]                 Signed: Sherilyn Cooter  A  10/11/2022, 9:54 AM

## 2022-10-13 ENCOUNTER — Encounter (HOSPITAL_COMMUNITY): Payer: Self-pay

## 2022-10-13 ENCOUNTER — Inpatient Hospital Stay (HOSPITAL_COMMUNITY)
Admission: EM | Admit: 2022-10-13 | Discharge: 2022-10-17 | DRG: 948 | Disposition: A | Discharge status: Skilled Nursing Facility | Payer: Medicare Other | Attending: Neurological Surgery | Admitting: Neurological Surgery

## 2022-10-13 ENCOUNTER — Other Ambulatory Visit: Payer: Self-pay

## 2022-10-13 DIAGNOSIS — I5022 Chronic systolic (congestive) heart failure: Secondary | ICD-10-CM | POA: Diagnosis present

## 2022-10-13 DIAGNOSIS — Z87442 Personal history of urinary calculi: Secondary | ICD-10-CM | POA: Diagnosis not present

## 2022-10-13 DIAGNOSIS — Z981 Arthrodesis status: Secondary | ICD-10-CM

## 2022-10-13 DIAGNOSIS — Z79899 Other long term (current) drug therapy: Secondary | ICD-10-CM | POA: Diagnosis not present

## 2022-10-13 DIAGNOSIS — M1990 Unspecified osteoarthritis, unspecified site: Secondary | ICD-10-CM | POA: Diagnosis present

## 2022-10-13 DIAGNOSIS — I110 Hypertensive heart disease with heart failure: Secondary | ICD-10-CM | POA: Diagnosis present

## 2022-10-13 DIAGNOSIS — Z87891 Personal history of nicotine dependence: Secondary | ICD-10-CM

## 2022-10-13 DIAGNOSIS — G8918 Other acute postprocedural pain: Secondary | ICD-10-CM | POA: Diagnosis present

## 2022-10-13 LAB — COMPREHENSIVE METABOLIC PANEL
ALT: 18 U/L (ref 0–44)
AST: 41 U/L (ref 15–41)
Albumin: 3.3 g/dL — ABNORMAL LOW (ref 3.5–5.0)
Alkaline Phosphatase: 65 U/L (ref 38–126)
Anion gap: 11 (ref 5–15)
BUN: 20 mg/dL (ref 8–23)
CO2: 21 mmol/L — ABNORMAL LOW (ref 22–32)
Calcium: 8.8 mg/dL — ABNORMAL LOW (ref 8.9–10.3)
Chloride: 101 mmol/L (ref 98–111)
Creatinine, Ser: 1.03 mg/dL (ref 0.61–1.24)
GFR, Estimated: >60 mL/min (ref >60)
Glucose, Bld: 111 mg/dL — ABNORMAL HIGH (ref 70–99)
Potassium: 4.3 mmol/L (ref 3.5–5.1)
Sodium: 133 mmol/L — ABNORMAL LOW (ref 135–145)
Total Bilirubin: 1.4 mg/dL — ABNORMAL HIGH (ref 0.3–1.2)
Total Protein: 6.2 g/dL — ABNORMAL LOW (ref 6.5–8.1)

## 2022-10-13 LAB — CBC WITH DIFFERENTIAL/PLATELET
Abs Immature Granulocytes: 0.07 10*3/uL (ref 0.00–0.07)
Basophils Absolute: 0 10*3/uL (ref 0.0–0.1)
Basophils Relative: 0 %
Eosinophils Absolute: 0 10*3/uL (ref 0.0–0.5)
Eosinophils Relative: 0 %
HCT: 41.3 % (ref 39.0–52.0)
Hemoglobin: 13.7 g/dL (ref 13.0–17.0)
Immature Granulocytes: 1 %
Lymphocytes Relative: 11 %
Lymphs Abs: 1.3 10*3/uL (ref 0.7–4.0)
MCH: 29.4 pg (ref 26.0–34.0)
MCHC: 33.2 g/dL (ref 30.0–36.0)
MCV: 88.6 fL (ref 80.0–100.0)
Monocytes Absolute: 1.2 10*3/uL — ABNORMAL HIGH (ref 0.1–1.0)
Monocytes Relative: 10 %
Neutro Abs: 9.2 10*3/uL — ABNORMAL HIGH (ref 1.7–7.7)
Neutrophils Relative %: 78 %
Platelets: 259 10*3/uL (ref 150–400)
RBC: 4.66 MIL/uL (ref 4.22–5.81)
RDW: 14.2 % (ref 11.5–15.5)
WBC: 11.9 10*3/uL — ABNORMAL HIGH (ref 4.0–10.5)
nRBC: 0 % (ref 0.0–0.2)

## 2022-10-13 LAB — URINALYSIS, ROUTINE W REFLEX MICROSCOPIC
Bacteria, UA: NONE SEEN
Bilirubin Urine: NEGATIVE
Glucose, UA: NEGATIVE mg/dL
Ketones, ur: NEGATIVE mg/dL
Leukocytes,Ua: NEGATIVE
Nitrite: NEGATIVE
Protein, ur: 100 mg/dL — AB
Specific Gravity, Urine: 1.016 (ref 1.005–1.030)
pH: 6 (ref 5.0–8.0)

## 2022-10-13 MED ORDER — METHOCARBAMOL 1000 MG/10ML IJ SOLN
500.0000 mg | Freq: Four times a day (QID) | INTRAVENOUS | Status: DC | PRN
  Filled 2022-10-13: qty 5

## 2022-10-13 MED ORDER — SACUBITRIL-VALSARTAN 24-26 MG PO TABS
1.0000 | ORAL_TABLET | Freq: Two times a day (BID) | ORAL | Status: DC
  Administered 2022-10-13 – 2022-10-17 (×8): 1 via ORAL
  Filled 2022-10-13 (×8): qty 1

## 2022-10-13 MED ORDER — POLYETHYLENE GLYCOL 3350 17 G PO PACK
17.0000 g | PACK | Freq: Every day | ORAL | Status: DC | PRN
  Administered 2022-10-13: 17 g via ORAL
  Filled 2022-10-13: qty 1

## 2022-10-13 MED ORDER — FLEET ENEMA 7-19 GM/118ML RE ENEM
1.0000 | ENEMA | Freq: Once | RECTAL | Status: DC | PRN
  Filled 2022-10-13: qty 1

## 2022-10-13 MED ORDER — ZOLPIDEM TARTRATE 5 MG PO TABS: 5.0000 mg | ORAL_TABLET | Freq: Every evening | ORAL | Status: DC | PRN

## 2022-10-13 MED ORDER — CARVEDILOL 3.125 MG PO TABS
3.1250 mg | ORAL_TABLET | Freq: Two times a day (BID) | ORAL | Status: DC
  Administered 2022-10-13 – 2022-10-17 (×8): 3.125 mg via ORAL
  Filled 2022-10-13 (×8): qty 1

## 2022-10-13 MED ORDER — ACETAMINOPHEN 325 MG PO TABS: 650.0000 mg | ORAL_TABLET | Freq: Four times a day (QID) | ORAL | Status: DC | PRN

## 2022-10-13 MED ORDER — OXYCODONE HCL 5 MG PO TABS
5.0000 mg | ORAL_TABLET | ORAL | Status: DC | PRN
  Administered 2022-10-13 – 2022-10-14 (×3): 5 mg via ORAL
  Filled 2022-10-13 (×3): qty 1

## 2022-10-13 MED ORDER — ACETAMINOPHEN 650 MG RE SUPP: 650.0000 mg | Freq: Four times a day (QID) | RECTAL | Status: DC | PRN

## 2022-10-13 MED ORDER — BISACODYL 10 MG RE SUPP
10.0000 mg | Freq: Every day | RECTAL | Status: DC | PRN
  Administered 2022-10-14: 10 mg via RECTAL
  Filled 2022-10-13: qty 1

## 2022-10-13 MED ORDER — HYDROMORPHONE HCL 1 MG/ML IJ SOLN
0.5000 mg | INTRAMUSCULAR | Status: DC | PRN
  Administered 2022-10-14: 1 mg via INTRAVENOUS
  Filled 2022-10-13: qty 1

## 2022-10-13 MED ORDER — DOCUSATE SODIUM 100 MG PO CAPS
100.0000 mg | ORAL_CAPSULE | Freq: Two times a day (BID) | ORAL | Status: DC
  Administered 2022-10-13 – 2022-10-17 (×9): 100 mg via ORAL
  Filled 2022-10-13 (×9): qty 1

## 2022-10-13 MED ORDER — METOPROLOL TARTRATE 5 MG/5ML IV SOLN: 5.0000 mg | Freq: Four times a day (QID) | INTRAVENOUS | Status: DC | PRN

## 2022-10-13 MED ORDER — ONDANSETRON HCL 4 MG PO TABS: 4.0000 mg | ORAL_TABLET | Freq: Four times a day (QID) | ORAL | Status: DC | PRN

## 2022-10-13 MED ORDER — ONDANSETRON HCL 4 MG/2ML IJ SOLN: 4.0000 mg | Freq: Four times a day (QID) | INTRAMUSCULAR | Status: DC | PRN

## 2022-10-13 NOTE — H&P (Signed)
Marco Alvarado is an 77 y.o. male.   Chief Complaint: Back pain HPI: Mr. Marco Alvarado underwent a decompression and fusion by Dr. Yetta Barre on 10/10/2022. He did well following surgery and was discharged home on 10/11/2022. Patient's postoperative pain become more severe starting last night and continued to worsen in spite of pain medication and a muscle relaxant. He noted his legs were feeling weaker and "giving out" on him. He reports he went down to the ground, but it wasn't a "true fall." His wife was unable to help him up and EMS was called. He and his wife live at home alone and feel the patient might need further work with therapies before returning home. The patient presently reports back pain. He denies numbness or tingling. He feels like his legs are weak when standing and walking. He rates his pain a 5/10 presently.  Past Medical History:  Diagnosis Date   Arthritis    Heart murmur    pt has had an echocardiogram 06/26/21   HFrEF (heart failure with reduced ejection fraction) (HCC)    History of kidney stones    White coat syndrome with diagnosis of hypertension     Past Surgical History:  Procedure Laterality Date   ANKLE FRACTURE SURGERY Right    ANTERIOR CERVICAL DECOMP/DISCECTOMY FUSION N/A 08/15/2022   Procedure: Anterior Cervical Decompression Fusion  - Cervical four-Cervical five - Cervical five-Cervical six - Cervical six-Cervical seven;  Surgeon: Arman Bogus, MD;  Location: [facility] OR;  Service: Neurosurgery;  Laterality: N/A;   KIDNEY SURGERY Left    TONSILLECTOMY      History reviewed. No pertinent family history. Social History:  reports that he quit smoking about 58 years ago. His smoking use included cigarettes. He started smoking about 45 years ago. He has never used smokeless tobacco. He reports that he does not currently use alcohol. He reports that he does not use drugs.  Allergies: No Known Allergies  (Not in a hospital admission)   Results for orders placed or  performed during the hospital encounter of 10/13/22 (from the past 48 hour(s))  CBC with Differential     Status: Abnormal   Collection Time: 10/13/22 12:04 PM  Result Value Ref Range   WBC 11.9 (H) 4.0 - 10.5 K/uL   RBC 4.66 4.22 - 5.81 MIL/uL   Hemoglobin 13.7 13.0 - 17.0 g/dL   HCT 16.1 09.6 - 04.5 %   MCV 88.6 80.0 - 100.0 fL   MCH 29.4 26.0 - 34.0 pg   MCHC 33.2 30.0 - 36.0 g/dL   RDW 40.9 81.1 - 91.4 %   Platelets 259 150 - 400 K/uL   nRBC 0.0 0.0 - 0.2 %   Neutrophils Relative % 78 %   Neutro Abs 9.2 (H) 1.7 - 7.7 K/uL   Lymphocytes Relative 11 %   Lymphs Abs 1.3 0.7 - 4.0 K/uL   Monocytes Relative 10 %   Monocytes Absolute 1.2 (H) 0.1 - 1.0 K/uL   Eosinophils Relative 0 %   Eosinophils Absolute 0.0 0.0 - 0.5 K/uL   Basophils Relative 0 %   Basophils Absolute 0.0 0.0 - 0.1 K/uL   Immature Granulocytes 1 %   Abs Immature Granulocytes 0.07 0.00 - 0.07 K/uL    Comment: Performed at [facility], [address], [place], [place] [address]  Urinalysis, Routine w reflex microscopic -Urine, Clean Catch     Status: Abnormal   Collection Time: 10/13/22 12:43 PM  Result Value Ref Range  Color, Urine YELLOW YELLOW   APPearance CLEAR CLEAR   Specific Gravity, Urine 1.016 1.005 - 1.030   pH 6.0 5.0 - 8.0   Glucose, UA NEGATIVE NEGATIVE mg/dL   Hgb urine dipstick SMALL (A) NEGATIVE   Bilirubin Urine NEGATIVE NEGATIVE   Ketones, ur NEGATIVE NEGATIVE mg/dL   Protein, ur 027 (A) NEGATIVE mg/dL   Nitrite NEGATIVE NEGATIVE   Leukocytes,Ua NEGATIVE NEGATIVE   RBC / HPF 0-5 0 - 5 RBC/hpf   WBC, UA 0-5 0 - 5 WBC/hpf   Bacteria, UA NONE SEEN NONE SEEN   Squamous Epithelial / HPF 0-5 0 - 5 /HPF   Mucus PRESENT    Sperm, UA PRESENT     Comment: Performed at [facility], [address] [place], [place] [address]   No results found.  Review of Systems  Constitutional: Negative.   HENT: Negative.    Eyes: Negative.   Respiratory: Negative.    Cardiovascular:  Negative.   Gastrointestinal: Negative.   Endocrine: Negative.   Genitourinary: Negative.   Musculoskeletal:  Positive for back pain and gait problem.  Skin: Negative.   Allergic/Immunologic: Negative.   Neurological:  Positive for weakness. Negative for dizziness, facial asymmetry, light-headedness, numbness and headaches.  Hematological: Negative.   Psychiatric/Behavioral: Negative.      Blood pressure (!) 134/101, pulse (!) 114, temperature 98.6 F (37 C), temperature source Oral, resp. rate 20, height 5\' 7"  (1.702 m), weight 59 kg, SpO2 100%. Physical Exam Constitutional:      Appearance: Normal appearance.  HENT:     Head: Normocephalic and atraumatic.     Nose: Nose normal.     Mouth/Throat:     Mouth: Mucous membranes are moist.     Pharynx: Oropharynx is clear.  Eyes:     Extraocular Movements: Extraocular movements intact.     Pupils: Pupils are equal, round, and reactive to light.  Cardiovascular:     Rate and Rhythm: Regular rhythm. Tachycardia present.     Pulses: Normal pulses.  Pulmonary:     Effort: Pulmonary effort is normal. No respiratory distress.  Abdominal:     General: Abdomen is flat.     Palpations: Abdomen is soft.  Musculoskeletal:        General: Normal range of motion.     Cervical back: Normal range of motion and neck supple.  Skin:    General: Skin is warm and dry.     Capillary Refill: Capillary refill takes less than 2 seconds.       Neurological:     General: No focal deficit present.     Mental Status: He is alert and oriented to person, place, and time.     Sensory: No sensory deficit.     Motor: No weakness.  Psychiatric:        Mood and Affect: Mood normal.        Behavior: Behavior normal.        Thought Content: Thought content normal.        Judgment: Judgment normal.      Assessment/Plan Patient will be admitted for pain control and to work with therapies. Discharge planning will depend upon recommendations from PT and  OT.  Floreen Comber, NP 10/13/2022, 1:11 PM

## 2022-10-13 NOTE — ED Triage Notes (Signed)
Pt arrived via ems to the er for the c/o lower back pain since yesterday. Pt had a lumbar fusion done 3 days ago. Pt was given pain meds at home for the pain and seemed to be working until last night. Pain level became un tolerable this morning. Bp and hr elevated. Wife at bedside.

## 2022-10-13 NOTE — Anesthesia Postprocedure Evaluation (Signed)
Anesthesia Post Note  Patient: Marco Alvarado  Procedure(s) Performed: Posterior Lumbar Interbody Fusion - Lumbar four-Lumbar five (Back) Laminectomy - Lumbar two-three- Lumbar three-Lumbar four (Back)     Patient location during evaluation: PACU Anesthesia Type: General Level of consciousness: awake and alert Pain management: pain level controlled Vital Signs Assessment: post-procedure vital signs reviewed and stable Respiratory status: spontaneous breathing, nonlabored ventilation, respiratory function stable and patient connected to nasal cannula oxygen Cardiovascular status: blood pressure returned to baseline and stable Postop Assessment: no apparent nausea or vomiting Anesthetic complications: no   No notable events documented.  Last Vitals:  Vitals:   10/11/22 0401 10/11/22 0712  BP: 129/82 (!) 140/86  Pulse: 68 95  Resp: 18 18  Temp: 36.5 C 36.6 C  SpO2: 99% 100%    Last Pain:  Vitals:   10/11/22 0800  TempSrc:   PainSc: 3                   

## 2022-10-13 NOTE — ED Notes (Signed)
ED TO INPATIENT HANDOFF REPORT  ED Nurse Name and Phone #: Gustavo Lah, [place] [phone]  S Name/Age/Gender Marco Alvarado 77 y.o. male Room/Bed: H014C/H014C  Code Status   Code Status: Full Code  Home/SNF/Other Home Patient oriented to: self, place, time, and situation Is this baseline? Yes   Triage Complete: Triage complete  Chief Complaint Postoperative pain after spinal surgery [G89.18]  Triage Note Pt arrived via ems to the er for the c/o lower back pain since yesterday. Pt had a lumbar fusion done 3 days ago. Pt was given pain meds at home for the pain and seemed to be working until last night. Pain level became un tolerable this morning. Bp and hr elevated. Wife at bedside.    Allergies No Known Allergies  Level of Care/Admitting Diagnosis ED Disposition     ED Disposition  Admit   Condition  --   Comment  Hospital Area: [facility] [[id number]]  Level of Care: Med-Surg [16]  May admit patient to [facility] or [facility] if equivalent level of care is available:: No  Covid Evaluation: Asymptomatic - no recent exposure (last 10 days) testing not required  Diagnosis: Postoperative pain after spinal surgery [[id number]]  Admitting Physician: Tressie Stalker [[id number]]  Attending Physician: Arman Bogus [[id number]]  Certification:: I certify this patient will need inpatient services for at least 2 midnights  Estimated Length of Stay: 2          B Medical/Surgery History Past Medical History:  Diagnosis Date   Arthritis    Heart murmur    pt has had an echocardiogram 06/26/21   HFrEF (heart failure with reduced ejection fraction) (HCC)    History of kidney stones    White coat syndrome with diagnosis of hypertension    Past Surgical History:  Procedure Laterality Date   ANKLE FRACTURE SURGERY Right    ANTERIOR CERVICAL DECOMP/DISCECTOMY FUSION N/A 08/15/2022   Procedure: Anterior Cervical Decompression Fusion  - Cervical four-Cervical five - Cervical  five-Cervical six - Cervical six-Cervical seven;  Surgeon: Arman Bogus, MD;  Location: [facility] OR;  Service: Neurosurgery;  Laterality: N/A;   KIDNEY SURGERY Left    TONSILLECTOMY       A IV Location/Drains/Wounds Patient Lines/Drains/Airways Status     Active Line/Drains/Airways     Name Placement date Placement time Site Days   Peripheral IV 10/13/22 20 G Anterior;Right Forearm 10/13/22  1222  Forearm  less than 1            Intake/Output Last 24 hours No intake or output data in the 24 hours ending 10/13/22 1315  Labs/Imaging Results for orders placed or performed during the hospital encounter of 10/13/22 (from the past 48 hour(s))  CBC with Differential     Status: Abnormal   Collection Time: 10/13/22 12:04 PM  Result Value Ref Range   WBC 11.9 (H) 4.0 - 10.5 K/uL   RBC 4.66 4.22 - 5.81 MIL/uL   Hemoglobin 13.7 13.0 - 17.0 g/dL   HCT 95.2 84.1 - 32.4 %   MCV 88.6 80.0 - 100.0 fL   MCH 29.4 26.0 - 34.0 pg   MCHC 33.2 30.0 - 36.0 g/dL   RDW 40.1 02.7 - 25.3 %   Platelets 259 150 - 400 K/uL   nRBC 0.0 0.0 - 0.2 %   Neutrophils Relative % 78 %   Neutro Abs 9.2 (H) 1.7 - 7.7 K/uL   Lymphocytes Relative 11 %   Lymphs Abs 1.3 0.7 -  4.0 K/uL   Monocytes Relative 10 %   Monocytes Absolute 1.2 (H) 0.1 - 1.0 K/uL   Eosinophils Relative 0 %   Eosinophils Absolute 0.0 0.0 - 0.5 K/uL   Basophils Relative 0 %   Basophils Absolute 0.0 0.0 - 0.1 K/uL   Immature Granulocytes 1 %   Abs Immature Granulocytes 0.07 0.00 - 0.07 K/uL    Comment: Performed at [facility], [address], [place], [place] [address]  Comprehensive metabolic panel     Status: Abnormal   Collection Time: 10/13/22 12:04 PM  Result Value Ref Range   Sodium 133 (L) 135 - 145 mmol/L   Potassium 4.3 3.5 - 5.1 mmol/L   Chloride 101 98 - 111 mmol/L   CO2 21 (L) 22 - 32 mmol/L   Glucose, Bld 111 (H) 70 - 99 mg/dL    Comment: Glucose reference range applies only to samples taken after fasting for at  least 8 hours.   BUN 20 8 - 23 mg/dL   Creatinine, Ser 0.27 0.61 - 1.24 mg/dL   Calcium 8.8 (L) 8.9 - 10.3 mg/dL   Total Protein 6.2 (L) 6.5 - 8.1 g/dL   Albumin 3.3 (L) 3.5 - 5.0 g/dL   AST 41 15 - 41 U/L   ALT 18 0 - 44 U/L   Alkaline Phosphatase 65 38 - 126 U/L   Total Bilirubin 1.4 (H) 0.3 - 1.2 mg/dL   GFR, Estimated >25 >36 mL/min    Comment: (NOTE) Calculated using the CKD-EPI Creatinine Equation (2021)    Anion gap 11 5 - 15    Comment: Performed at [facility], [address], [place], [place] [address]  Urinalysis, Routine w reflex microscopic -Urine, Clean Catch     Status: Abnormal   Collection Time: 10/13/22 12:43 PM  Result Value Ref Range   Color, Urine YELLOW YELLOW   APPearance CLEAR CLEAR   Specific Gravity, Urine 1.016 1.005 - 1.030   pH 6.0 5.0 - 8.0   Glucose, UA NEGATIVE NEGATIVE mg/dL   Hgb urine dipstick SMALL (A) NEGATIVE   Bilirubin Urine NEGATIVE NEGATIVE   Ketones, ur NEGATIVE NEGATIVE mg/dL   Protein, ur 474 (A) NEGATIVE mg/dL   Nitrite NEGATIVE NEGATIVE   Leukocytes,Ua NEGATIVE NEGATIVE   RBC / HPF 0-5 0 - 5 RBC/hpf   WBC, UA 0-5 0 - 5 WBC/hpf   Bacteria, UA NONE SEEN NONE SEEN   Squamous Epithelial / HPF 0-5 0 - 5 /HPF   Mucus PRESENT    Sperm, UA PRESENT     Comment: Performed at [facility], [address] [place], [place] [address]   No results found.  Pending Labs Unresulted Labs (From admission, onward)    None       Vitals/Pain Today's Vitals   10/13/22 1117 10/13/22 1121  BP: (!) 134/101   Pulse: (!) 114   Resp: 20   Temp: 98.6 F (37 C)   TempSrc: Oral   SpO2: 100%   Weight: 59 kg   Height: 5\' 7"  (1.702 m)   PainSc: 5  5     Isolation Precautions No active isolations  Medications Medications - No data to display  Mobility walks with person assist, recent lumbar fusion      Focused Assessments Neuro Assessment Handoff:  Swallow screen pass? Yes          Neuro Assessment:   Neuro  Checks:      Has TPA  been given? No If patient is a Neuro Trauma and patient is going to OR before floor call report to 4N Charge nurse: [phone] or [phone]   R Recommendations: See Admitting Provider Note  Report given to:   Additional Notes: pain 5/10, pain meds not working per pt from neurosurgery

## 2022-10-13 NOTE — ED Provider Notes (Signed)
[place] EMERGENCY DEPARTMENT AT [facility] Provider Note   CSN: [id number] Arrival date & time: 10/13/22  1109     History  Chief Complaint  Patient presents with   Back Pain    Marco Alvarado is a 77 y.o. male.  HPI     77 year old male with a history of heart failure with reduced ejection fraction, arthritis, lumbar fusion on August 2 with Dr. Yetta Barre, who presents with concern for back pain, difficulty caring for self at home.  He reports that he has had the same pain since he left the hospital, but they are finding it more difficult to be taken care of at home.  This morning he was trying to get up to have breakfast, and the pain was severe, and his wife was not able to help him get up.  Also, last night when he was going to the bathroom, the pain was severe and he eased himself down onto the floor.  He denies any acute worsening of his pain since his surgery, denies numbness, weakness, fevers, chest pain, shortness of breath or other concerns.  He has not had a bowel movement since the surgery.  He has overall been eating okay.  Denies any difficulty urinating.  Reports when he had his cervical surgery he was able to go home, however this pain is more severe, and he is having a difficult time ambulating and caring for himself and his wife is not strong enough to help him.    Past Medical History:  Diagnosis Date   Arthritis    Heart murmur    pt has had an echocardiogram 06/26/21   HFrEF (heart failure with reduced ejection fraction) (HCC)    History of kidney stones    White coat syndrome with diagnosis of hypertension       Home Medications Prior to Admission medications   Medication Sig Start Date End Date Taking? Authorizing Provider  carvedilol (COREG) 3.125 MG tablet Take 3.125 mg by mouth 2 (two) times daily with a meal. 08/08/22  Yes [provider]  methocarbamol (ROBAXIN) 500 MG tablet Take 1 tablet (500 mg total) by mouth every 6 (six) hours  as needed for muscle spasms. 10/10/22  Yes Arman Bogus, MD  oxyCODONE (OXY IR/ROXICODONE) 5 MG immediate release tablet Take 1 tablet (5 mg total) by mouth every 4 (four) hours as needed for severe pain ((score 7 to 10)). 10/10/22  Yes Arman Bogus, MD  sacubitril-valsartan (ENTRESTO) 24-26 MG Take 1 tablet by mouth 2 (two) times daily. 08/08/22  Yes [provider]      Allergies    Patient has no known allergies.    Review of Systems   Review of Systems  Physical Exam Updated Vital Signs BP (!) 133/90   Pulse (!) 101   Temp 97.6 F (36.4 C)   Resp 18   Ht 5\' 7"  (1.702 m)   Wt 59 kg   SpO2 97%   BMI 20.37 kg/m  Physical Exam Vitals and nursing note reviewed.  Constitutional:      General: He is not in acute distress.    Appearance: Normal appearance. He is not ill-appearing, toxic-appearing or diaphoretic.  HENT:     Head: Normocephalic.  Eyes:     Conjunctiva/sclera: Conjunctivae normal.  Cardiovascular:     Rate and Rhythm: Normal rate and regular rhythm.     Pulses: Normal pulses.     Heart sounds: No murmur heard.  No friction rub. No gallop.  Pulmonary:     Effort: Pulmonary effort is normal. No respiratory distress.     Breath sounds: No wheezing.  Abdominal:     General: There is no distension.     Tenderness: There is no abdominal tenderness.     Comments: Lumbar brace in place  Musculoskeletal:        General: No deformity or signs of injury.     Cervical back: No rigidity.  Skin:    General: Skin is warm and dry.     Coloration: Skin is not jaundiced or pale.     Comments: Will defer lumbar fusion wound check to NSU consult in setting of recent surgery  Neurological:     General: No focal deficit present.     Mental Status: He is alert and oriented to person, place, and time.     Comments: 5/5 lower extremity strength, no numbness, normal pulses     ED Results / Procedures / Treatments   Labs (all labs ordered are listed, but  only abnormal results are displayed) Labs Reviewed  CBC WITH DIFFERENTIAL/PLATELET - Abnormal; Notable for the following components:      Result Value   WBC 11.9 (*)    Neutro Abs 9.2 (*)    Monocytes Absolute 1.2 (*)    All other components within normal limits  COMPREHENSIVE METABOLIC PANEL - Abnormal; Notable for the following components:   Sodium 133 (*)    CO2 21 (*)    Glucose, Bld 111 (*)    Calcium 8.8 (*)    Total Protein 6.2 (*)    Albumin 3.3 (*)    Total Bilirubin 1.4 (*)    All other components within normal limits  URINALYSIS, ROUTINE W REFLEX MICROSCOPIC - Abnormal; Notable for the following components:   Hgb urine dipstick SMALL (*)    Protein, ur 100 (*)    All other components within normal limits    EKG None  Radiology No results found.  Procedures Procedures    Medications Ordered in ED Medications  carvedilol (COREG) tablet 3.125 mg (3.125 mg Oral Given 10/13/22 1736)  sacubitril-valsartan (ENTRESTO) 24-26 mg per tablet (1 tablet Oral Given 10/13/22 2232)  acetaminophen (TYLENOL) tablet 650 mg (has no administration in time range)    Or  acetaminophen (TYLENOL) suppository 650 mg (has no administration in time range)  oxyCODONE (Oxy IR/ROXICODONE) immediate release tablet 5 mg (5 mg Oral Given 10/13/22 1736)  methocarbamol (ROBAXIN) 500 mg in dextrose 5 % 50 mL IVPB (has no administration in time range)  zolpidem (AMBIEN) tablet 5 mg (has no administration in time range)  docusate sodium (COLACE) capsule 100 mg (100 mg Oral Given 10/13/22 2232)  polyethylene glycol (MIRALAX / GLYCOLAX) packet 17 g (17 g Oral Given 10/13/22 1736)  bisacodyl (DULCOLAX) suppository 10 mg (has no administration in time range)  sodium phosphate (FLEET) 7-19 GM/118ML enema 1 enema (has no administration in time range)  ondansetron (ZOFRAN) tablet 4 mg (has no administration in time range)    Or  ondansetron (ZOFRAN) injection 4 mg (has no administration in time range)   metoprolol tartrate (LOPRESSOR) injection 5 mg (has no administration in time range)  HYDROmorphone (DILAUDID) injection 0.5-1 mg (has no administration in time range)    ED Course/ Medical Decision Making/ A&P  77 year old male with a history of heart failure with reduced ejection fraction, arthritis, lumbar fusion on August 2 with Dr. Yetta Barre, who presents with concern for back pain, difficulty caring for self at home.  His history is most consistent with difficult pain control following recent surgery, difficulty caring for self, and suspects likely need for more PT, OT and rehab placement.  He denies any acute numbness or weakness.  Denies fevers.  I have consulted neurosurgery who will come evaluate him to evaluate for other concerns for post surgical complications or infection, however at this time I have low clinical suspicion for that.  He denies chest pain, shortness of breath or other infectious symptoms.  He is a little bit tachycardic on arrival to the emergency department, suspect this is likely secondary to pain.  Order labs including CBC, CMP, and a urinalysis for further evaluation.  Consulted NSU given recent discharge, concern for need for admission for pain control in setting of recent surgery and need for physical therapy.   Labs completed and personally evaluated by me without significant anemia, very mild leukocytosis, no UTI, no clinically significant electrolyte abnormalities. Admitted by NSU for further care.        Final Clinical Impression(s) / ED Diagnoses Final diagnoses:  Postoperative pain after spinal surgery    Rx / DC Orders ED Discharge Orders     None         Alvira Monday, MD 10/13/22 2254

## 2022-10-14 ENCOUNTER — Inpatient Hospital Stay (HOSPITAL_COMMUNITY): Payer: Medicare Other

## 2022-10-14 MED ORDER — DEXAMETHASONE SODIUM PHOSPHATE 4 MG/ML IJ SOLN
4.0000 mg | Freq: Four times a day (QID) | INTRAMUSCULAR | Status: DC
  Administered 2022-10-14 – 2022-10-17 (×14): 4 mg via INTRAVENOUS
  Filled 2022-10-14 (×14): qty 1

## 2022-10-14 NOTE — Plan of Care (Signed)
  Problem: Activity: Goal: Ability to tolerate increased activity will improve Outcome: Progressing Goal: Will remain free from falls Outcome: Progressing   Problem: Clinical Measurements: Goal: Ability to maintain clinical measurements within normal limits will improve Outcome: Progressing Goal: Postoperative complications will be avoided or minimized Outcome: Progressing Goal: Diagnostic test results will improve Outcome: Progressing   Problem: Skin Integrity: Goal: Will show signs of wound healing Outcome: Progressing   Problem: Bladder/Genitourinary: Goal: Urinary functional status for postoperative course will improve Outcome: Progressing   Problem: Clinical Measurements: Goal: Cardiovascular complication will be avoided Outcome: Progressing   Problem: Activity: Goal: Risk for activity intolerance will decrease Outcome: Progressing   Problem: Nutrition: Goal: Adequate nutrition will be maintained Outcome: Progressing   Problem: Elimination: Goal: Will not experience complications related to bowel motility Outcome: Progressing Goal: Will not experience complications related to urinary retention Outcome: Progressing   Problem: Pain Managment: Goal: General experience of comfort will improve Outcome: Progressing

## 2022-10-14 NOTE — Progress Notes (Signed)
Inpatient Rehab Admissions Coordinator:   Per PT recommendation pt was screened for CIR by Estill Dooms, PT, DPT.  Note readmitted following d/c home after spinal surgery.  On re-admit and eval, PT notes LE weakness, significant compared to d/c level of function, redness/warmth/swelling to incision, and confusion.  WBC mildly elevated.  Discussed with Dr. Berline Chough.  Feel pt could potentially be a candidate, depending on workup.  UA negative, xray pending read.  Will follow for workup.   Estill Dooms, PT, DPT Admissions Coordinator [phone] 10/14/22  10:35 AM

## 2022-10-14 NOTE — Plan of Care (Signed)

## 2022-10-14 NOTE — Evaluation (Addendum)
Physical Therapy Evaluation Patient Details Name: Marco Alvarado MRN: [id number] DOB: 1945/08/31 Today's Date: 10/14/2022  History of Present Illness  77 y.o. male readmitted 10/13/2022 for postoperative pain and falls. S/p decompressive lumbar laminectomy, hemi-facetectomy, and foraminotomies L4-5, PLIF L 4-5, intertransverse arthrodesis L3-4, and left decompressive lumbar hemilaminectomy medial facetectomy foraminotomies L2-4 10/10/2022. PMH significant for recent ACDF with anterior cervical arthrodesis, HFrEF, kidney stones, HTN, white coat syndrome.  Clinical Impression  Pt admitted with above. On recent admission, 8/3, pt ambulating 200 ft with a walker at a supervision level. Pt reports two falls since d/c home and pt spouse has been unable to physically assist pt. Pt with significant decline in mobility. Now requiring heavy moderate assist for stand pivot transfers using RW. Unable to ambulate. Pt denies radicular pain or new numbness or tingling. On manual muscle testing, BLE's are equal and grossly 4/5, however, functionally, pt with weakness and decreased trunk/core activation. Noted redness/warmth/swelling to left of incision and RN notified. Pt also with some confusion, with decreased recall, problem solving, and awareness during evaluation. Currently, recommend post acute rehab to address deficits, maximize functional mobility and decrease caregiver burden.       If plan is discharge home, recommend the following: A lot of help with walking and/or transfers;A lot of help with bathing/dressing/bathroom;Direct supervision/assist for medications management;Direct supervision/assist for financial management;Assist for transportation;Help with stairs or ramp for entrance   Can travel by private vehicle   No    Equipment Recommendations None recommended by PT (pt well equipped)  Recommendations for Other Services       Functional Status Assessment Patient has had a recent decline in their  functional status and demonstrates the ability to make significant improvements in function in a reasonable and predictable amount of time.     Precautions / Restrictions Precautions Precautions: Fall;Back Precaution Booklet Issued: No Required Braces or Orthoses: Spinal Brace Spinal Brace: Lumbar corset;Applied in sitting position Restrictions Weight Bearing Restrictions: No      Mobility  Bed Mobility Overal bed mobility: Needs Assistance Bed Mobility: Rolling, Sidelying to Sit Rolling: Max assist Sidelying to sit: Max assist       General bed mobility comments: HOB elevated per pt request, use of bed pad to partially roll and pivot hips to edge of bed. Pt minimally initiating    Transfers Overall transfer level: Needs assistance Equipment used: Rolling walker (2 wheels) Transfers: Sit to/from Stand, Bed to chair/wheelchair/BSC Sit to Stand: Mod assist Stand pivot transfers: Mod assist         General transfer comment: Heavy modA to power up from elevated bed height (successful on second attempt); increased time to rise and weightbear through BLE's. Pivoting towards right with cues for stepping initiation. Decreased bilateral foot clearance    Ambulation/Gait                  Stairs            Wheelchair Mobility     Tilt Bed    Modified Rankin (Stroke Patients Only)       Balance Overall balance assessment: Needs assistance Sitting-balance support: No upper extremity supported, Feet supported Sitting balance-Leahy Scale: Fair     Standing balance support: Bilateral upper extremity supported Standing balance-Leahy Scale: Poor Standing balance comment: reliant on RW                             Pertinent Vitals/Pain Pain Assessment Pain  Assessment: Faces Faces Pain Scale: Hurts even more Pain Location: back Pain Descriptors / Indicators: Grimacing, Operative site guarding Pain Intervention(s): Limited activity within  patient's tolerance, Monitored during session    Home Living Family/patient expects to be discharged to:: Private residence Living Arrangements: Other relatives;Spouse/significant other Available Help at Discharge: Family;Available 24 hours/day Type of Home: House Home Access: Stairs to enter Entrance Stairs-Rails: Right Entrance Stairs-Number of Steps: 3 Alternate Level Stairs-Number of Steps: does not use Home Layout: Two level;Able to live on main level with bedroom/bathroom Home Equipment: Shower seat;Grab bars - toilet;Grab bars - tub/shower;Cane - single point;Wheelchair - Forensic psychologist (2 wheels);BSC/3in1      Prior Function Prior Level of Function : Independent/Modified Independent             Mobility Comments: ambulated with SPC prior to sx. ADLs Comments: independent in ADL prior to sx; wife assist with homemaking     Hand Dominance   Dominant Hand: Right    Extremity/Trunk Assessment   Upper Extremity Assessment Upper Extremity Assessment: Defer to OT evaluation    Lower Extremity Assessment Lower Extremity Assessment: RLE deficits/detail;LLE deficits/detail RLE Deficits / Details: Grossly 4/5 LLE Deficits / Details: Grossly 4/5    Cervical / Trunk Assessment Cervical / Trunk Assessment: Back Surgery  Communication   Communication: No difficulties  Cognition Arousal/Alertness: Awake/alert Behavior During Therapy: WFL for tasks assessed/performed Overall Cognitive Status: Impaired/Different from baseline Area of Impairment: Memory, Safety/judgement, Awareness, Problem solving                     Memory: Decreased short-term memory   Safety/Judgement: Decreased awareness of deficits, Decreased awareness of safety Awareness: Intellectual Problem Solving: Decreased initiation, Requires verbal cues General Comments: Pt A&Ox3, not oriented to day of week, but looking to calendar to re-orient. Pt asking if overhead light looks like a  "toilet paper roll," (also asked the same question earlier to RN) and reports on Saturday night that his "wife was making dinner off his back brace." Decreased recall in providing accurate time line of events between discharge and readmission        General Comments      Exercises     Assessment/Plan    PT Assessment Patient needs continued PT services  PT Problem List Decreased strength;Decreased activity tolerance;Decreased balance;Decreased mobility;Decreased cognition;Decreased safety awareness;Pain       PT Treatment Interventions DME instruction;Gait training;Stair training;Functional mobility training;Therapeutic activities;Therapeutic exercise;Balance training;Patient/family education    PT Goals (Current goals can be found in the Care Plan section)  Acute Rehab PT Goals Patient Stated Goal: not fall PT Goal Formulation: With patient Time For Goal Achievement: 10/28/22 Potential to Achieve Goals: Good    Frequency Min 1X/week     Co-evaluation               AM-PAC PT "6 Clicks" Mobility  Outcome Measure Help needed turning from your back to your side while in a flat bed without using bedrails?: A Lot Help needed moving from lying on your back to sitting on the side of a flat bed without using bedrails?: A Lot Help needed moving to and from a bed to a chair (including a wheelchair)?: Total Help needed standing up from a chair using your arms (e.g., wheelchair or bedside chair)?: Total Help needed to walk in hospital room?: Total Help needed climbing 3-5 steps with a railing? : Total 6 Click Score: 8    End of Session Equipment Utilized During Treatment: Gait  belt;Back brace Activity Tolerance: Patient tolerated treatment well Patient left: in chair;with call bell/phone within reach;with chair alarm set Nurse Communication: Mobility status PT Visit Diagnosis: Unsteadiness on feet (R26.81);History of falling (Z91.81);Difficulty in walking, not elsewhere  classified (R26.2);Pain Pain - part of body:  (back)    Time: 1610-9604 PT Time Calculation (min) (ACUTE ONLY): 20 min   Charges:   PT Evaluation $PT Eval Moderate Complexity: 1 Mod   PT General Charges $$ ACUTE PT VISIT: 1 Visit         Lillia Pauls, PT, DPT Acute Rehabilitation Services Office [phone]   Norval Morton 10/14/2022, 10:23 AM

## 2022-10-14 NOTE — Progress Notes (Signed)
Subjective: Patient reports doing a little better  Objective: Vital signs in last 24 hours: Temp:  [97.6 F (36.4 C)-98.8 F (37.1 C)] 97.9 F (36.6 C) (08/06 0751) Pulse Rate:  [86-114] 97 (08/06 0751) Resp:  [17-20] 17 (08/06 0751) BP: (100-153)/(59-103) 138/92 (08/06 0751) SpO2:  [93 %-100 %] 93 % (08/06 0751) Weight:  [59 kg] 59 kg (08/05 1117)  Intake/Output from previous day: No intake/output data recorded. Intake/Output this shift: No intake/output data recorded.  Neurologic: Grossly normal  Lab Results: Lab Results  Component Value Date   WBC 11.9 (H) 10/13/2022   HGB 13.7 10/13/2022   HCT 41.3 10/13/2022   MCV 88.6 10/13/2022   PLT 259 10/13/2022   Lab Results  Component Value Date   INR 0.9 10/01/2022   BMET Lab Results  Component Value Date   NA 133 (L) 10/13/2022   K 4.3 10/13/2022   CL 101 10/13/2022   CO2 21 (L) 10/13/2022   GLUCOSE 111 (H) 10/13/2022   BUN 20 10/13/2022   CREATININE 1.03 10/13/2022   CALCIUM 8.8 (L) 10/13/2022    Studies/Results: No results found.  Assessment/Plan: Postop day 4 lumbar fusion who was readmitted for pain control. Will see how he does getting up with therapy today but he was able to get to the bathroom with 1 assist this morning. Will put him on some decadron to see if this helps as well. I dont see any signs or symptoms that warrant advanced imaging at this time especially considering they would be hard to decipher given normal postop fluid collections. I will order xrays today though to look at hardware.   LOS: 1 day    Tiana Loft  10/14/2022, 7:55 AM

## 2022-10-14 NOTE — Evaluation (Signed)
Occupational Therapy Evaluation Patient Details Name: Marco Alvarado MRN: [id number] DOB: 10-18-1945 Today's Date: 10/14/2022   History of Present Illness 77 y.o. male readmitted 10/13/2022 for postoperative pain and falls. S/p decompressive lumbar laminectomy, hemi-facetectomy, and foraminotomies L4-5, PLIF L 4-5, intertransverse arthrodesis L3-4, and left decompressive lumbar hemilaminectomy medial facetectomy foraminotomies L2-4 10/10/2022. PMH significant for recent ACDF with anterior cervical arthrodesis, HFrEF, kidney stones, HTN, white coat syndrome.   Clinical Impression   Patient admitted for the diagnosis above.  PTA he was at home with his spouse recovering from a recent back surgery.  Patient was using a SPC for mobility in the home, and was initially doing well with ADL completion. But with increasing back pain, the patient was needing increasing assist.  Confusion/AMS and, poor safety, lower extremity weakness and poor dynamic balance are the deficits.  Currently the patient is needing up to Mod A for simple transfers and lower body ADL.  OT is indicated in the acute setting to address deficits, and Patient will benefit from intensive inpatient follow up therapy, >3 hours/day.  Despite his confusion, he is participating well and following commands.  The patient does demonstrate the ability to participate with 3+ hours of rehab/day, and shows the potential to reach a Mod I level.         Recommendations for follow up therapy are one component of a multi-disciplinary discharge planning process, led by the attending physician.  Recommendations may be updated based on patient status, additional functional criteria and insurance authorization.   Assistance Recommended at Discharge Frequent or constant Supervision/Assistance  Patient can return home with the following Assist for transportation;Assistance with cooking/housework;A lot of help with walking and/or transfers;A lot of help with  bathing/dressing/bathroom;Direct supervision/assist for medications management    Functional Status Assessment  Patient has had a recent decline in their functional status and demonstrates the ability to make significant improvements in function in a reasonable and predictable amount of time.  Equipment Recommendations  None recommended by OT    Recommendations for Other Services       Precautions / Restrictions Precautions Precautions: (P) Fall;Back Required Braces or Orthoses: (P) Spinal Brace Spinal Brace: (P) Lumbar corset;Applied in sitting position Restrictions Weight Bearing Restrictions: (P) No      Mobility Bed Mobility Overal bed mobility: Needs Assistance Bed Mobility: Sidelying to Sit   Sidelying to sit: Min assist, Mod assist            Transfers Overall transfer level: Needs assistance Equipment used: Rolling walker (2 wheels) Transfers: Sit to/from Stand, Bed to chair/wheelchair/BSC Sit to Stand: Mod assist Stand pivot transfers: Min assist, Mod assist                Balance Overall balance assessment: Needs assistance Sitting-balance support: No upper extremity supported, Feet supported Sitting balance-Leahy Scale: Fair     Standing balance support: Bilateral upper extremity supported Standing balance-Leahy Scale: Poor                             ADL either performed or assessed with clinical judgement   ADL   Eating/Feeding: Set up;Sitting   Grooming: Set up;Sitting   Upper Body Bathing: Supervision/ safety;Sitting   Lower Body Bathing: Moderate assistance;Sit to/from stand   Upper Body Dressing : Minimal assistance;Sitting   Lower Body Dressing: Moderate assistance;Sit to/from stand   Toilet Transfer: Moderate assistance;BSC/3in1;Stand-pivot;Rolling walker (2 wheels);Minimal assistance  Vision Baseline Vision/History: 1 Wears glasses Patient Visual Report: No change from baseline        Perception     Praxis      Pertinent Vitals/Pain Pain Assessment Pain Assessment: Faces Faces Pain Scale: Hurts a little bit Pain Location: back Pain Descriptors / Indicators: Tender Pain Intervention(s): Monitored during session     Hand Dominance Right   Extremity/Trunk Assessment Upper Extremity Assessment Upper Extremity Assessment: Overall WFL for tasks assessed   Lower Extremity Assessment Lower Extremity Assessment: Defer to PT evaluation   Cervical / Trunk Assessment Cervical / Trunk Assessment: Back Surgery   Communication Communication Communication: No difficulties   Cognition Arousal/Alertness: Awake/alert Behavior During Therapy: WFL for tasks assessed/performed Overall Cognitive Status: Impaired/Different from baseline                       Memory: Decreased recall of precautions, Decreased short-term memory   Safety/Judgement: Decreased awareness of safety   Problem Solving: Slow processing, Requires verbal cues, Requires tactile cues       General Comments       Exercises     Shoulder Instructions      Home Living Family/patient expects to be discharged to:: Private residence Living Arrangements: Other relatives;Spouse/significant other Available Help at Discharge: Family;Available 24 hours/day Type of Home: House Home Access: Stairs to enter [employer] of Steps: 3 Entrance Stairs-Rails: Right Home Layout: Two level;Able to live on main level with bedroom/bathroom     Bathroom Shower/Tub: Tub/shower unit;Walk-in shower   Bathroom Toilet: Standard Bathroom Accessibility: Yes How Accessible: Accessible via walker Home Equipment: Shower seat;Grab bars - toilet;Grab bars - tub/shower;Cane - single point;Wheelchair - Forensic psychologist (2 wheels);BSC/3in1          Prior Functioning/Environment Prior Level of Function : Independent/Modified Independent             Mobility Comments: ambulated with SPC  prior to sx. ADLs Comments: independent in ADL prior to sx; wife assist with homemaking        OT Problem List: Decreased strength;Impaired balance (sitting and/or standing);Decreased activity tolerance;Decreased knowledge of use of DME or AE;Decreased knowledge of precautions;Decreased safety awareness;Decreased cognition;Pain      OT Treatment/Interventions: Self-care/ADL training;Therapeutic activities;Patient/family education;Balance training;DME and/or AE instruction    OT Goals(Current goals can be found in the care plan section) Acute Rehab OT Goals Patient Stated Goal: Return home OT Goal Formulation: With patient Time For Goal Achievement: 10/28/22 Potential to Achieve Goals: Good ADL Goals Pt Will Perform Grooming: with modified independence;standing Pt Will Perform Lower Body Bathing: with modified independence;sit to/from stand Pt Will Perform Lower Body Dressing: with modified independence;sit to/from stand Pt Will Transfer to Toilet: with modified independence;ambulating;regular height toilet  OT Frequency: Min 1X/week    Co-evaluation              AM-PAC OT "6 Clicks" Daily Activity     Outcome Measure Help from another person eating meals?: None Help from another person taking care of personal grooming?: A Little Help from another person toileting, which includes using toliet, bedpan, or urinal?: A Lot Help from another person bathing (including washing, rinsing, drying)?: A Lot Help from another person to put on and taking off regular upper body clothing?: A Little Help from another person to put on and taking off regular lower body clothing?: A Lot 6 Click Score: 16   End of Session Equipment Utilized During Treatment: Rolling walker (2 wheels);Back brace Nurse Communication: Mobility  status  Activity Tolerance: Patient tolerated treatment well Patient left: in chair;with call bell/phone within reach;with chair alarm set  OT Visit Diagnosis:  Unsteadiness on feet (R26.81);Muscle weakness (generalized) (M62.81);Other symptoms and signs involving cognitive function                Time: 4332-9518 OT Time Calculation (min): 21 min Charges:  OT General Charges $OT Visit: 1 Visit OT Evaluation $OT Eval Moderate Complexity: 1 Mod  10/14/2022  RP, OTR/L  Acute Rehabilitation Services  Office:  [phone]   Suzanna Obey 10/14/2022, 4:34 PM

## 2022-10-15 NOTE — Plan of Care (Signed)

## 2022-10-15 NOTE — Progress Notes (Signed)
Physical Therapy Treatment Patient Details Name: Marco Alvarado MRN: [id number] DOB: 05-Dec-1945 Today's Date: 10/15/2022   History of Present Illness 77 y.o. male readmitted 10/13/2022 for postoperative pain and falls. S/p decompressive lumbar laminectomy, hemi-facetectomy, and foraminotomies L4-5, PLIF L 4-5, intertransverse arthrodesis L3-4, and left decompressive lumbar hemilaminectomy medial facetectomy foraminotomies L2-4 10/10/2022. PMH significant for recent ACDF with anterior cervical arthrodesis, HFrEF, kidney stones, HTN, white coat syndrome.    PT Comments  Continuing work on functional mobility and activity tolerance;  Session focused on progressive amb with RW with a particular focus on safety and balance (given 2 falls at home postop); Pt walked in the hallway with RW, showing decr stability, and incr fall risk; 2-3 instances of significant loss of balance, requiring Max assist to prevent falling; Notable decr L hip stance stability leading to trendelenburg gait with uncontrolled R foot placement, consistent scissoring; Unable to walk safely without physical assistance, including caregiver holding on to pt to monitor for lass of balance and be able to prevent falls; Patient will benefit from intensive inpatient follow up therapy, >3 hours/day    If plan is discharge home, recommend the following: A lot of help with walking and/or transfers;A lot of help with bathing/dressing/bathroom;Direct supervision/assist for medications management;Direct supervision/assist for financial management;Assist for transportation;Help with stairs or ramp for entrance   Can travel by private vehicle     No (perhaps soon)  Equipment Recommendations  None recommended by PT (well equipped)    Recommendations for Other Services Rehab consult     Precautions / Restrictions Precautions Precautions: Fall;Back Required Braces or Orthoses: Spinal Brace Spinal Brace: Lumbar corset;Applied in sitting  position Restrictions Weight Bearing Restrictions: No     Mobility  Bed Mobility                    Transfers Overall transfer level: Needs assistance Equipment used: Rolling walker (2 wheels) Transfers: Sit to/from Stand Sit to Stand: Mod assist           General transfer comment: stood x2 from recliner; first rep pt needed multiple repetitions of cues for hand placement and to scoot to edge of chair; made bouncing like movements of hips on and off the edge of chair (similar to rocking, but more vertical adn hips lifted off of the chair) 4-5 x before trying to lift off without success; mod assist for control, and cues to move more slowly    Ambulation/Gait Ambulation/Gait assistance: Min assist, Max assist, +2 safety/equipment Gait Distance (Feet): 55 Feet (x2) Assistive device: Rolling walker (2 wheels) Gait Pattern/deviations: Scissoring, Trendelenburg       General Gait Details: noting decr L hip stability in stance, leading to R hip drop to the extent that his R foot tended to step directly in front of L foot, or crossing midline and scissoring; mildly corrected with cues; holding RW for stability; incr trunk movement within RW with overall less trunk control and less stability; 2 instances of loss of balance with pt needing Max assist to prevent fall   Stairs             Wheelchair Mobility     Tilt Bed    Modified Rankin (Stroke Patients Only)       Balance     Sitting balance-Leahy Scale: Fair       Standing balance-Leahy Scale: Poor Standing balance comment: reliant on RW  Cognition Arousal: Alert Behavior During Therapy: WFL for tasks assessed/performed, Impulsive Overall Cognitive Status: No family/caregiver present to determine baseline cognitive functioning Area of Impairment: Memory, Safety/judgement, Awareness, Problem solving                     Memory: Decreased recall of  precautions, Decreased short-term memory   Safety/Judgement: Decreased awareness of safety Awareness: Intellectual Problem Solving: Slow processing, Requires verbal cues, Requires tactile cues          Exercises      General Comments General comments (skin integrity, edema, etc.): Required assist to don brace; reviewed brace wear      Pertinent Vitals/Pain Pain Assessment Pain Assessment: Faces Faces Pain Scale: Hurts a little bit Pain Location: back Pain Descriptors / Indicators: Tender Pain Intervention(s): Monitored during session    Home Living                          Prior Function            PT Goals (current goals can now be found in the care plan section) Acute Rehab PT Goals Patient Stated Goal: not fall; be able to go home PT Goal Formulation: With patient Time For Goal Achievement: 10/28/22 Potential to Achieve Goals: Good Progress towards PT goals: Progressing toward goals    Frequency    Min 1X/week      PT Plan      Co-evaluation              AM-PAC PT "6 Clicks" Mobility   Outcome Measure  Help needed turning from your back to your side while in a flat bed without using bedrails?: A Lot Help needed moving from lying on your back to sitting on the side of a flat bed without using bedrails?: A Little Help needed moving to and from a bed to a chair (including a wheelchair)?: A Lot Help needed standing up from a chair using your arms (e.g., wheelchair or bedside chair)?: A Lot Help needed to walk in hospital room?: A Lot Help needed climbing 3-5 steps with a railing? : A Lot 6 Click Score: 13    End of Session Equipment Utilized During Treatment: Back brace Activity Tolerance: Patient tolerated treatment well Patient left: in chair;with call bell/phone within reach;with chair alarm set Nurse Communication: Mobility status PT Visit Diagnosis: Unsteadiness on feet (R26.81);History of falling (Z91.81);Difficulty in walking,  not elsewhere classified (R26.2);Pain Pain - part of body:  (back)     Time: 6295-2841 PT Time Calculation (min) (ACUTE ONLY): 18 min  Charges:    $Gait Training: 8-22 mins PT General Charges $$ ACUTE PT VISIT: 1 Visit                     Van Clines, PT  Acute Rehabilitation Services Office [phone] Secure Chat welcomed    Levi Aland 10/15/2022, 3:54 PM

## 2022-10-15 NOTE — Progress Notes (Signed)
Inpatient Rehab Admissions Coordinator:   Rescreening.  Note continues to require significant assist for mobility with therapy with LOB requiring max assist to recover. I will ask Dr. Berline Chough to formally consult tomorrow.  Will place rehab consult order per protocol.   Estill Dooms, PT, DPT Admissions Coordinator [phone] 10/15/22  4:45 PM

## 2022-10-15 NOTE — Progress Notes (Signed)
Subjective: Patient reports doing a lot better today. Pain has improved significantly.  Objective: Vital signs in last 24 hours: Temp:  [98.5 F (36.9 C)-98.6 F (37 C)] 98.6 F (37 C) (08/07 0804) Pulse Rate:  [71-102] 89 (08/07 0804) Resp:  [16-17] 16 (08/07 0804) BP: (105-143)/(64-92) 109/64 (08/07 0804) SpO2:  [96 %-100 %] 100 % (08/07 0804)  Intake/Output from previous day: 08/06 0701 - 08/07 0700 In: 480 [P.O.:480] Out: 970 [Urine:970] Intake/Output this shift: Total I/O In: 240 [P.O.:240] Out: 175 [Urine:175]  Neurologic: Grossly normal  Lab Results: Lab Results  Component Value Date   WBC 11.9 (H) 10/13/2022   HGB 13.7 10/13/2022   HCT 41.3 10/13/2022   MCV 88.6 10/13/2022   PLT 259 10/13/2022   Lab Results  Component Value Date   INR 0.9 10/01/2022   BMET Lab Results  Component Value Date   NA 133 (L) 10/13/2022   K 4.3 10/13/2022   CL 101 10/13/2022   CO2 21 (L) 10/13/2022   GLUCOSE 111 (H) 10/13/2022   BUN 20 10/13/2022   CREATININE 1.03 10/13/2022   CALCIUM 8.8 (L) 10/13/2022    Studies/Results: DG Lumbar Spine 2-3 Views  Result Date: 10/14/2022 CLINICAL DATA:  77 year old male status post spine surgery. EXAM: LUMBAR SPINE - 2-3 VIEW COMPARISON:  Intraoperative lumbar images 8224. Preoperative lumbar radiographs 09/18/2022. FINDINGS: AP and lateral views at 0949 hours. Normal lumbar segmentation. New L4-L5 posterior and interbody fusion hardware from last month. Hardware appears intact. Underlying grade 1 spondylolisthesis appears mildly regressed. Elsewhere Stable visualized osseous structures., including mild L1 compression and bulky lower thoracic endplate osteophytes. No acute osseous abnormality identified. Left abdominal calcifications persists suspicious for nephrolithiasis. But underlying aortoiliac vascular calcifications are noted. Nonobstructed bowel-gas pattern. IMPRESSION: 1. New L4-L5 posterior and interbody fusion hardware. No adverse  features. No acute osseous abnormality identified. 2. Multiple left abdominal calcifications suspicious for nephrolithiasis. Underlying calcified atherosclerosis. Electronically Signed   By: Odessa Fleming M.D.   On: 10/14/2022 12:59    Assessment/Plan: Incision is CDI. We will wait to see what rehab MD says. PT is suggesting inpatient rehab. Patient is really adamant that he wants to go home and not got to inpatient rehab. Patient's wife is against him coming home stating "I can't take care of him." I actually got the patient up and walked him in the room. He was able to go from sitting to standing independently and seemed very steady and strong on his feet.    LOS: 2 days    Tiana Loft [facility] 10/15/2022, 12:27 PM

## 2022-10-16 NOTE — TOC Initial Note (Signed)
Transition of Care [facility]) - Initial/Assessment Note    Patient Details  Name: Marco Alvarado MRN: [id number] Date of Birth: 1945-07-30  [facility]) CM/SW Contact:    Lorri Frederick, LCSW Phone Number: 10/16/2022, 4:05 PM  Clinical Narrative:     CSW met with pt regarding DC plan.  Pt aware that [facility] unable to accept him, however pt states he would like to DC home rather than pursue SNF.  Wife not present, permission given for CSW to speak with wife about DC plan.  Pt reports she works and is not home during the day.  Pt from home with wife, no current services.   CSW spoke with wife by phone, she reports she is not able to provide 24/7 care for pt, does want to pursue SNF, will speak with pt about it.  1530: TC Wife, FF contact again with pt, they are in agreement with plan for SNF.  Medicare choice document provided, permission given to send out referral in hub.  Referral sent out in hub for SNF.              Expected Discharge Plan: Skilled Nursing Facility Barriers to Discharge: Continued Medical Work up, SNF Pending bed offer   Patient Goals and CMS Choice Patient states their goals for this hospitalization and ongoing recovery are:: stay alive CMS Medicare.gov Compare Post Acute Care list provided to:: Patient Choice offered to / list presented to : Patient      Expected Discharge Plan and Services In-house Referral: Clinical Social Work   Post Acute Care Choice: Skilled Nursing Facility Living arrangements for the past 2 months: Single Family Home                                      Prior Living Arrangements/Services Living arrangements for the past 2 months: Single Family Home Lives with:: Spouse Patient language and need for interpreter reviewed:: Yes Do you feel safe going back to the place where you live?: Yes      Need for Family Participation in Patient Care: Yes (Comment) Care giver support system in place?: Yes (comment) Current home  services: Other (comment) (none) Criminal Activity/Legal Involvement Pertinent to Current Situation/Hospitalization: No - Comment as needed  Activities of Daily Living Home Assistive Devices/Equipment: Cane (specify quad or straight), Wheelchair, Environmental consultant (specify type) ADL Screening (condition at time of admission) Patient's cognitive ability adequate to safely complete daily activities?: Yes Is the patient deaf or have difficulty hearing?: No Does the patient have difficulty seeing, even when wearing glasses/contacts?: No Does the patient have difficulty concentrating, remembering, or making decisions?: No Patient able to express need for assistance with ADLs?: Yes Does the patient have difficulty dressing or bathing?: Yes Independently performs ADLs?: Yes (appropriate for developmental age) Does the patient have difficulty walking or climbing stairs?: Yes Weakness of Legs: Both Weakness of Arms/Hands: None  Permission Sought/Granted Permission sought to share information with : Family Supports Permission granted to share information with : Yes, Verbal Permission Granted  Share Information with NAME: [name]  Permission granted to share info w AGENCY: SNF        Emotional Assessment Appearance:: Appears stated age Attitude/Demeanor/Rapport: Engaged Affect (typically observed): Appropriate, Pleasant Orientation: : Oriented to Self, Oriented to Place, Oriented to  Time, Oriented to Situation      Admission diagnosis:  Postoperative pain after spinal surgery [G89.18] Patient Active  Problem List   Diagnosis Date Noted   Postoperative pain after spinal surgery 10/13/2022   S/P lumbar fusion 10/10/2022   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   PCP:  Patient, No Pcp Per Pharmacy:   [id number] - [place], [place] - [address] AT [facility] [address] [place] [address] Phone: [phone] Fax: [phone]     Social  Determinants of Health (SDOH) Social History: SDOH Screenings   Food Insecurity: No Food Insecurity (10/14/2022)  Housing: Low Risk  (10/14/2022)  Transportation Needs: No Transportation Needs (10/14/2022)  Utilities: Not At Risk (10/14/2022)  Financial Resource Strain: Low Risk  (10/02/2022)   Received from [facility]  Social Connections: Unknown (07/09/2021)   Received from [facility]  Tobacco Use: Medium Risk (10/13/2022)   SDOH Interventions:     Readmission Risk Interventions     No data to display

## 2022-10-16 NOTE — Progress Notes (Signed)
Subjective: Patient reports doing a lot better than yesterday. Pain is improving and he thinks he's getting around better  Objective: Vital signs in last 24 hours: Temp:  [97.5 F (36.4 C)-97.8 F (36.6 C)] 97.8 F (36.6 C) (08/08 0747) Pulse Rate:  [71-93] 71 (08/08 0747) Resp:  [15-16] 15 (08/07 2009) BP: (107-156)/(72-97) 149/97 (08/08 0747) SpO2:  [98 %-100 %] 99 % (08/08 0747)  Intake/Output from previous day: 08/07 0701 - 08/08 0700 In: 720 [P.O.:720] Out: 775 [Urine:775] Intake/Output this shift: No intake/output data recorded.  Neurologic: Grossly normal  Lab Results: Lab Results  Component Value Date   WBC 11.9 (H) 10/13/2022   HGB 13.7 10/13/2022   HCT 41.3 10/13/2022   MCV 88.6 10/13/2022   PLT 259 10/13/2022   Lab Results  Component Value Date   INR 0.9 10/01/2022   BMET Lab Results  Component Value Date   NA 133 (L) 10/13/2022   K 4.3 10/13/2022   CL 101 10/13/2022   CO2 21 (L) 10/13/2022   GLUCOSE 111 (H) 10/13/2022   BUN 20 10/13/2022   CREATININE 1.03 10/13/2022   CALCIUM 8.8 (L) 10/13/2022    Studies/Results: DG Lumbar Spine 2-3 Views  Result Date: 10/14/2022 CLINICAL DATA:  77 year old male status post spine surgery. EXAM: LUMBAR SPINE - 2-3 VIEW COMPARISON:  Intraoperative lumbar images 8224. Preoperative lumbar radiographs 09/18/2022. FINDINGS: AP and lateral views at 0949 hours. Normal lumbar segmentation. New L4-L5 posterior and interbody fusion hardware from last month. Hardware appears intact. Underlying grade 1 spondylolisthesis appears mildly regressed. Elsewhere Stable visualized osseous structures., including mild L1 compression and bulky lower thoracic endplate osteophytes. No acute osseous abnormality identified. Left abdominal calcifications persists suspicious for nephrolithiasis. But underlying aortoiliac vascular calcifications are noted. Nonobstructed bowel-gas pattern. IMPRESSION: 1. New L4-L5 posterior and interbody fusion  hardware. No adverse features. No acute osseous abnormality identified. 2. Multiple left abdominal calcifications suspicious for nephrolithiasis. Underlying calcified atherosclerosis. Electronically Signed   By: Odessa Fleming M.D.   On: 10/14/2022 12:59    Assessment/Plan: S/p lumbar fusion readmitted for pain control and therapies. Will wait to see what rehab MD says today as far as if he is a candidate for inpatient rehab or not. Although he is not eager to go to inpatient rehab.   LOS: 3 days    Tiana Loft  10/16/2022, 8:12 AM

## 2022-10-16 NOTE — Plan of Care (Signed)

## 2022-10-16 NOTE — Progress Notes (Signed)
Physical Therapy Treatment Patient Details Name: Marco Alvarado MRN: [id number] DOB: [birth year] Today's Date: 10/16/2022   History of Present Illness 77 y.o. male readmitted 10/13/2022 for postoperative pain and falls. S/p decompressive lumbar laminectomy, hemi-facetectomy, and foraminotomies L4-5, PLIF L 4-5, intertransverse arthrodesis L3-4, and left decompressive lumbar hemilaminectomy medial facetectomy foraminotomies L2-4 10/10/2022. PMH significant for recent ACDF with anterior cervical arthrodesis, HFrEF, kidney stones, HTN, white coat syndrome.    PT Comments  Pt with improved mobility compared to yesterday but remains unsteady and unaware of deficits and back precautions. Feel pt needs to continue therapy to reduce fall risk and improve mobility. Patient will benefit from continued inpatient follow up therapy, <3 hours/day     If plan is discharge home, recommend the following: Direct supervision/assist for medications management;Direct supervision/assist for financial management;Assist for transportation;Help with stairs or ramp for entrance;A little help with walking and/or transfers;A little help with bathing/dressing/bathroom   Can travel by private vehicle     Yes  Equipment Recommendations  None recommended by PT    Recommendations for Other Services Rehab consult     Precautions / Restrictions Precautions Precautions: Fall;Back Required Braces or Orthoses: Spinal Brace Spinal Brace: Lumbar corset;Applied in sitting position Restrictions Weight Bearing Restrictions: No     Mobility  Bed Mobility Overal bed mobility: Needs Assistance Bed Mobility: Sidelying to Sit, Sit to Sidelying Rolling: Contact guard assist Sidelying to sit: Contact guard assist     Sit to sidelying: Min assist General bed mobility comments: Verbal cues for technique. Assist to bring legs back up into bed    Transfers Overall transfer level: Needs assistance Equipment used: Rolling walker (2  wheels) Transfers: Sit to/from Stand Sit to Stand: Min assist           General transfer comment: Assist to power up and for balance    Ambulation/Gait Ambulation/Gait assistance: Min assist Gait Distance (Feet): 150 Feet Assistive device: Rolling walker (2 wheels) Gait Pattern/deviations: Trendelenburg, Step-through pattern, Decreased stride length, Trunk flexed, Narrow base of support Gait velocity: decr Gait velocity interpretation: 1.31 - 2.62 ft/sec, indicative of limited community ambulator   General Gait Details: Assist for balance and support. Significant trendelenburg gait with rt hip drop. Pt unable to tell me if this is baseline   Optometrist     Tilt Bed    Modified [place] (Stroke Patients Only)       Balance Overall balance assessment: Needs assistance Sitting-balance support: No upper extremity supported, Feet supported Sitting balance-Leahy Scale: Fair     Standing balance support: Bilateral upper extremity supported Standing balance-Leahy Scale: Poor Standing balance comment: walker and contact guard for static standing                            Cognition Arousal: Alert Behavior During Therapy: WFL for tasks assessed/performed, Impulsive Overall Cognitive Status: No family/caregiver present to determine baseline cognitive functioning Area of Impairment: Memory, Safety/judgement, Awareness, Problem solving                     Memory: Decreased recall of precautions, Decreased short-term memory   Safety/Judgement: Decreased awareness of safety Awareness: Intellectual Problem Solving: Slow processing, Requires verbal cues, Requires tactile cues General Comments: Pt needs frequent cues to follow back precautions. Cues to don spinal brace.        Exercises  General Comments        Pertinent Vitals/Pain Pain Assessment Pain Assessment: Faces Faces Pain Scale: No hurt    Home  Living                          Prior Function            PT Goals (current goals can now be found in the care plan section) Progress towards PT goals: Progressing toward goals    Frequency    Min 1X/week      PT Plan      Co-evaluation              AM-PAC PT "6 Clicks" Mobility   Outcome Measure  Help needed turning from your back to your side while in a flat bed without using bedrails?: A Little Help needed moving from lying on your back to sitting on the side of a flat bed without using bedrails?: A Little Help needed moving to and from a bed to a chair (including a wheelchair)?: A Little Help needed standing up from a chair using your arms (e.g., wheelchair or bedside chair)?: A Little Help needed to walk in hospital room?: A Little Help needed climbing 3-5 steps with a railing? : A Lot 6 Click Score: 17    End of Session Equipment Utilized During Treatment: Back brace;Gait belt Activity Tolerance: Patient tolerated treatment well Patient left: with call bell/phone within reach;in bed;with bed alarm set   PT Visit Diagnosis: Unsteadiness on feet (R26.81);History of falling (Z91.81);Difficulty in walking, not elsewhere classified (R26.2)     Time: 1601-0932 PT Time Calculation (min) (ACUTE ONLY): 13 min  Charges:    $Gait Training: 8-22 mins PT General Charges $$ ACUTE PT VISIT: 1 Visit                     [facility] PT Acute Rehabilitation Services Office [phone]    Angelina Ok [facility] 10/16/2022, 5:10 PM

## 2022-10-16 NOTE — Progress Notes (Addendum)
IP rehab admissions - Rehab MD reviewed chart and patient lacks medical necessity for an acute inpatient rehab admission.  Options will likely be SNF or home with [facility].  I will sign off for inpatient rehab at this time.  Call me for questions.  [phone]  I met with patient to let him know that we will not pursue an inpatient rehab admission.  He hopes to be able to progress and discharge directly to him home.  [phone]

## 2022-10-16 NOTE — Plan of Care (Deleted)
Problem: Education: Goal: Ability to verbalize activity precautions or restrictions will improve Outcome: Progressing Goal: Knowledge of the prescribed therapeutic regimen will improve Outcome: Progressing Goal: Understanding of discharge needs will improve Outcome: Progressing   Problem: Activity: Goal: Ability to avoid complications of mobility impairment will improve 10/16/2022 1326 by Mateo Flow, RN Outcome: Progressing 10/16/2022 1245 by Mateo Flow, RN Outcome: Progressing Goal: Ability to tolerate increased activity will improve 10/16/2022 1326 by Mateo Flow, RN Outcome: Progressing 10/16/2022 1245 by Mateo Flow, RN Outcome: Progressing Goal: Will remain free from falls 10/16/2022 1326 by Mateo Flow, RN Outcome: Progressing 10/16/2022 1245 by Mateo Flow, RN Outcome: Progressing   Problem: Bowel/Gastric: Goal: Gastrointestinal status for postoperative course will improve 10/16/2022 1326 by Mateo Flow, RN Outcome: Progressing 10/16/2022 1245 by Mateo Flow, RN Outcome: Progressing   Problem: Clinical Measurements: Goal: Ability to maintain clinical measurements within normal limits will improve Outcome: Progressing Goal: Postoperative complications will be avoided or minimized Outcome: Progressing Goal: Diagnostic test results will improve Outcome: Progressing   Problem: Pain Management: Goal: Pain level will decrease 10/16/2022 1326 by Mateo Flow, RN Outcome: Progressing 10/16/2022 1245 by Mateo Flow, RN Outcome: Progressing   Problem: Skin Integrity: Goal: Will show signs of wound healing 10/16/2022 1326 by Mateo Flow, RN Outcome: Progressing 10/16/2022 1245 by Mateo Flow, RN Outcome: Progressing   Problem: Health Behavior/Discharge Planning: Goal: Identification of resources available to assist in meeting health care needs will improve Outcome: Progressing   Problem: Bladder/Genitourinary: Goal: Urinary  functional status for postoperative course will improve Outcome: Progressing   Problem: Education: Goal: Knowledge of General Education information will improve Description: Including pain rating scale, medication(s)/side effects and non-pharmacologic comfort measures Outcome: Progressing   Problem: Health Behavior/Discharge Planning: Goal: Ability to manage health-related needs will improve Outcome: Progressing   Problem: Clinical Measurements: Goal: Ability to maintain clinical measurements within normal limits will improve Outcome: Progressing Goal: Will remain free from infection Outcome: Progressing Goal: Diagnostic test results will improve Outcome: Progressing Goal: Respiratory complications will improve Outcome: Progressing Goal: Cardiovascular complication will be avoided Outcome: Progressing   Problem: Activity: Goal: Risk for activity intolerance will decrease Outcome: Progressing   Problem: Nutrition: Goal: Adequate nutrition will be maintained Outcome: Progressing   Problem: Coping: Goal: Level of anxiety will decrease Outcome: Progressing   Problem: Elimination: Goal: Will not experience complications related to bowel motility Outcome: Progressing Goal: Will not experience complications related to urinary retention Outcome: Progressing   Problem: Pain Managment: Goal: General experience of comfort will improve Outcome: Progressing   Problem: Safety: Goal: Ability to remain free from injury will improve Outcome: Progressing   Problem: Skin Integrity: Goal: Risk for impaired skin integrity will decrease Outcome: Progressing   Problem: Education: Goal: Knowledge of General Education information will improve Description: Including pain rating scale, medication(s)/side effects and non-pharmacologic comfort measures Outcome: Progressing   Problem: Health Behavior/Discharge Planning: Goal: Ability to manage health-related needs will improve Outcome:  Progressing   Problem: Clinical Measurements: Goal: Ability to maintain clinical measurements within normal limits will improve Outcome: Progressing Goal: Will remain free from infection Outcome: Progressing Goal: Diagnostic test results will improve Outcome: Progressing Goal: Respiratory complications will improve Outcome: Progressing Goal: Cardiovascular complication will be avoided Outcome: Progressing   Problem: Activity: Goal: Risk for activity intolerance will decrease Outcome: Progressing   Problem: Nutrition: Goal: Adequate nutrition will be maintained Outcome: Progressing   Problem: Coping: Goal: Level of anxiety will  decrease Outcome: Progressing   Problem: Elimination: Goal: Will not experience complications related to bowel motility Outcome: Progressing Goal: Will not experience complications related to urinary retention Outcome: Progressing   Problem: Pain Managment: Goal: General experience of comfort will improve Outcome: Progressing   Problem: Safety: Goal: Ability to remain free from injury will improve Outcome: Progressing   Problem: Skin Integrity: Goal: Risk for impaired skin integrity will decrease Outcome: Progressing

## 2022-10-16 NOTE — Plan of Care (Signed)
  Problem: Activity: Goal: Ability to avoid complications of mobility impairment will improve Outcome: Progressing Goal: Ability to tolerate increased activity will improve Outcome: Progressing Goal: Will remain free from falls Outcome: Progressing   Problem: Bowel/Gastric: Goal: Gastrointestinal status for postoperative course will improve Outcome: Progressing   Problem: Pain Management: Goal: Pain level will decrease Outcome: Progressing   Problem: Skin Integrity: Goal: Will show signs of wound healing Outcome: Progressing

## 2022-10-16 NOTE — [place] FL2 (Signed)
[place] MEDICAID FL2 LEVEL OF CARE FORM     IDENTIFICATION  Patient Name: Marco Alvarado Birthdate: 09-01-1945 Sex: male Admission Date (Current Location): 10/13/2022  [facility] and [place] Number:  Producer, television/film/video and Address:  [facility], [address], [place], [place] [address]      Provider Number: [id number]  Attending Physician Name and Address:  Arman Bogus, MD  Relative Name and Phone Number:  chevez, rivest   [phone]    Current Level of Care: Hospital Recommended Level of Care: Skilled Nursing Facility Prior Approval Number:    Date Approved/Denied:   PASRR Number: [id number]  Discharge Plan: SNF    Current Diagnoses: Patient Active Problem List   Diagnosis Date Noted   Postoperative pain after spinal surgery 10/13/2022   S/P lumbar fusion 10/10/2022   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    Orientation RESPIRATION BLADDER Height & Weight     Self, Time, Situation, Place  Normal Continent Weight: 130 lb 1.1 oz (59 kg) Height:  5\' 7"  (170.2 cm)  BEHAVIORAL SYMPTOMS/MOOD NEUROLOGICAL BOWEL NUTRITION STATUS      Continent Diet (see discharge summary)  AMBULATORY STATUS COMMUNICATION OF NEEDS Skin   Extensive Assist Verbally Surgical wounds                       Personal Care Assistance Level of Assistance  Bathing, Feeding, Dressing Bathing Assistance: Limited assistance Feeding assistance: Independent Dressing Assistance: Limited assistance     Functional Limitations Info  Sight, Hearing, Speech Sight Info: Adequate Hearing Info: Adequate Speech Info: Adequate    SPECIAL CARE FACTORS FREQUENCY  PT (By licensed PT), OT (By licensed OT)     PT Frequency: 5x week OT Frequency: 5x week            Contractures Contractures Info: Not present    Additional Factors Info  Code Status, Allergies Code Status Info: full Allergies Info: NKA           Current Medications (10/16/2022):   This is the current hospital active medication list Current Facility-Administered Medications  Medication Dose Route Frequency Provider Last Rate Last Admin   acetaminophen (TYLENOL) tablet 650 mg  650 mg Oral Q6H PRN Val Eagle D, NP       Or   acetaminophen (TYLENOL) suppository 650 mg  650 mg Rectal Q6H PRN Val Eagle D, NP       bisacodyl (DULCOLAX) suppository 10 mg  10 mg Rectal Daily PRN Val Eagle D, NP   10 mg at 10/14/22 0411   carvedilol (COREG) tablet 3.125 mg  3.125 mg Oral BID WC Bergman, Meghan D, NP   3.125 mg at 10/16/22 0805   dexamethasone (DECADRON) injection 4 mg  4 mg Intravenous Q6H Meyran, Tiana Loft, NP   4 mg at 10/16/22 1226   docusate sodium (COLACE) capsule 100 mg  100 mg Oral BID Val Eagle D, NP   100 mg at 10/16/22 1051   HYDROmorphone (DILAUDID) injection 0.5-1 mg  0.5-1 mg Intravenous Q2H PRN Val Eagle D, NP   1 mg at 10/14/22 0408   methocarbamol (ROBAXIN) 500 mg in dextrose 5 % 50 mL IVPB  500 mg Intravenous Q6H PRN Bergman, Meghan D, NP       metoprolol tartrate (LOPRESSOR) injection 5 mg  5 mg Intravenous Q6H PRN Bergman, Meghan D, NP       ondansetron (ZOFRAN) tablet 4 mg  4 mg Oral Q6H  PRN Val Eagle D, NP       Or   ondansetron (ZOFRAN) injection 4 mg  4 mg Intravenous Q6H PRN Val Eagle D, NP       oxyCODONE (Oxy IR/ROXICODONE) immediate release tablet 5 mg  5 mg Oral Q4H PRN Val Eagle D, NP   5 mg at 10/14/22 2101   polyethylene glycol (MIRALAX / GLYCOLAX) packet 17 g  17 g Oral Daily PRN Val Eagle D, NP   17 g at 10/13/22 1736   sacubitril-valsartan (ENTRESTO) 24-26 mg per tablet  1 tablet Oral BID Val Eagle D, NP   1 tablet at 10/16/22 1051   sodium phosphate (FLEET) 7-19 GM/118ML enema 1 enema  1 enema Rectal Once PRN Bergman, Meghan D, NP       zolpidem (AMBIEN) tablet 5 mg  5 mg Oral QHS PRN Floreen Comber, NP         Discharge Medications: Please see discharge summary for a list of  discharge medications.  Relevant Imaging Results:  Relevant Lab Results:   Additional Information SSN: [id number]  Lorri Frederick, LCSW

## 2022-10-17 MED ORDER — METHOCARBAMOL 500 MG PO TABS: 500.0000 mg | ORAL_TABLET | Freq: Four times a day (QID) | ORAL | 1 refills | Status: DC | PRN

## 2022-10-17 MED ORDER — OXYCODONE-ACETAMINOPHEN 5-325 MG PO TABS: 1.0000 | ORAL_TABLET | ORAL | 0 refills | Status: DC | PRN

## 2022-10-17 NOTE — TOC Transition Note (Signed)
Transition of Care [facility]) - CM/SW Discharge Note   Patient Details  Name: Marco Alvarado MRN: [id number] Date of Birth: March 26, 1945  [facility] CM/SW Contact:  Lorri Frederick, LCSW Phone Number: 10/17/2022, 10:42 AM   Clinical Narrative:   Pt discharging to [facility].  RN call [phone] for report.   Wife will return to hospital at 3pm to transport pt to SNF.  Please make sure hard script for pain medication has been received from MD before DC.  Final next level of care: Skilled Nursing Facility Barriers to Discharge: Barriers Resolved   Patient Goals and CMS Choice CMS Medicare.gov Compare Post Acute Care list provided to:: Patient Choice offered to / list presented to : Patient  Discharge Placement                [facility] at[facility]) Patient to be transferred to facility by: wife [name] Name of family member notified: wife [name] Patient and family notified of of transfer: 10/17/22  Discharge Plan and Services Additional resources added to the After Visit Summary for   In-house Referral: Clinical Social Work   Post Acute Care Choice: Skilled Nursing Facility                               Social Determinants of Health (SDOH) Interventions SDOH Screenings   Food Insecurity: No Food Insecurity (10/14/2022)  Housing: Low Risk  (10/14/2022)  Transportation Needs: No Transportation Needs (10/14/2022)  Utilities: Not At Risk (10/14/2022)  Financial Resource Strain: Low Risk  (10/02/2022)   Received from [facility]  Social Connections: Unknown (07/09/2021)   [facility] from [facility]  Tobacco Use: Medium Risk (10/13/2022)     Readmission Risk Interventions     No data to display

## 2022-10-17 NOTE — Progress Notes (Signed)
Discharge report called to [facility], report given to [place], RN who will be receiving the patient.  AVS given to patients wife who will be transporting the patient to [facility] for rehab.

## 2022-10-17 NOTE — Plan of Care (Signed)
Problem: Education: Goal: Ability to verbalize activity precautions or restrictions will improve 10/17/2022 1442 by Milderd Meager, RN Outcome: Adequate for Discharge 10/17/2022 1023 by Milderd Meager, RN Outcome: Progressing Goal: Knowledge of the prescribed therapeutic regimen will improve Outcome: Adequate for Discharge Goal: Understanding of discharge needs will improve 10/17/2022 1442 by Milderd Meager, RN Outcome: Adequate for Discharge 10/17/2022 1023 by Milderd Meager, RN Outcome: Progressing   Problem: Activity: Goal: Ability to avoid complications of mobility impairment will improve 10/17/2022 1442 by Milderd Meager, RN Outcome: Adequate for Discharge 10/17/2022 1023 by Milderd Meager, RN Outcome: Progressing Goal: Ability to tolerate increased activity will improve Outcome: Adequate for Discharge Goal: Will remain free from falls 10/17/2022 1442 by Milderd Meager, RN Outcome: Adequate for Discharge 10/17/2022 1023 by Milderd Meager, RN Outcome: Progressing   Problem: Bowel/Gastric: Goal: Gastrointestinal status for postoperative course will improve Outcome: Adequate for Discharge   Problem: Clinical Measurements: Goal: Ability to maintain clinical measurements within normal limits will improve Outcome: Adequate for Discharge Goal: Postoperative complications will be avoided or minimized Outcome: Adequate for Discharge Goal: Diagnostic test results will improve Outcome: Adequate for Discharge   Problem: Pain Management: Goal: Pain level will decrease 10/17/2022 1442 by Milderd Meager, RN Outcome: Adequate for Discharge 10/17/2022 1023 by Milderd Meager, RN Outcome: Progressing   Problem: Skin Integrity: Goal: Will show signs of wound healing Outcome: Adequate for Discharge   Problem: Health Behavior/Discharge Planning: Goal: Identification of resources available to assist in meeting health care needs will improve Outcome: Adequate for Discharge   Problem:  Bladder/Genitourinary: Goal: Urinary functional status for postoperative course will improve Outcome: Adequate for Discharge   Problem: Education: Goal: Knowledge of General Education information will improve Description: Including pain rating scale, medication(s)/side effects and non-pharmacologic comfort measures 10/17/2022 1442 by Milderd Meager, RN Outcome: Adequate for Discharge 10/17/2022 1023 by Milderd Meager, RN Outcome: Progressing   Problem: Health Behavior/Discharge Planning: Goal: Ability to manage health-related needs will improve Outcome: Adequate for Discharge   Problem: Clinical Measurements: Goal: Ability to maintain clinical measurements within normal limits will improve Outcome: Adequate for Discharge Goal: Will remain free from infection Outcome: Adequate for Discharge Goal: Diagnostic test results will improve Outcome: Adequate for Discharge Goal: Respiratory complications will improve Outcome: Adequate for Discharge Goal: Cardiovascular complication will be avoided Outcome: Adequate for Discharge   Problem: Activity: Goal: Risk for activity intolerance will decrease Outcome: Adequate for Discharge   Problem: Nutrition: Goal: Adequate nutrition will be maintained Outcome: Adequate for Discharge   Problem: Coping: Goal: Level of anxiety will decrease Outcome: Adequate for Discharge   Problem: Elimination: Goal: Will not experience complications related to bowel motility Outcome: Adequate for Discharge Goal: Will not experience complications related to urinary retention Outcome: Adequate for Discharge   Problem: Pain Managment: Goal: General experience of comfort will improve Outcome: Adequate for Discharge   Problem: Safety: Goal: Ability to remain free from injury will improve Outcome: Adequate for Discharge   Problem: Skin Integrity: Goal: Risk for impaired skin integrity will decrease Outcome: Adequate for Discharge   Problem:  Education: Goal: Knowledge of General Education information will improve Description: Including pain rating scale, medication(s)/side effects and non-pharmacologic comfort measures Outcome: Adequate for Discharge   Problem: Health Behavior/Discharge Planning: Goal: Ability to manage health-related needs will improve Outcome: Adequate for Discharge   Problem: Clinical Measurements: Goal: Ability to maintain clinical measurements within normal limits will improve Outcome: Adequate for Discharge Goal:  Will remain free from infection Outcome: Adequate for Discharge Goal: Diagnostic test results will improve Outcome: Adequate for Discharge Goal: Respiratory complications will improve Outcome: Adequate for Discharge Goal: Cardiovascular complication will be avoided Outcome: Adequate for Discharge   Problem: Activity: Goal: Risk for activity intolerance will decrease Outcome: Adequate for Discharge   Problem: Nutrition: Goal: Adequate nutrition will be maintained Outcome: Adequate for Discharge   Problem: Coping: Goal: Level of anxiety will decrease Outcome: Adequate for Discharge   Problem: Elimination: Goal: Will not experience complications related to bowel motility Outcome: Adequate for Discharge Goal: Will not experience complications related to urinary retention Outcome: Adequate for Discharge   Problem: Pain Managment: Goal: General experience of comfort will improve Outcome: Adequate for Discharge   Problem: Safety: Goal: Ability to remain free from injury will improve Outcome: Adequate for Discharge   Problem: Skin Integrity: Goal: Risk for impaired skin integrity will decrease Outcome: Adequate for Discharge

## 2022-10-17 NOTE — Progress Notes (Signed)
Occupational Therapy Treatment Patient Details Name: Marco Alvarado MRN: [id number] DOB: 11-13-1945 Today's Date: 10/17/2022   History of present illness 77 y.o. male readmitted 10/13/2022 for postoperative pain and falls. S/p decompressive lumbar laminectomy, hemi-facetectomy, and foraminotomies L4-5, PLIF L 4-5, intertransverse arthrodesis L3-4, and left decompressive lumbar hemilaminectomy medial facetectomy foraminotomies L2-4 10/10/2022. PMH significant for recent ACDF with anterior cervical arthrodesis, HFrEF, kidney stones, HTN, white coat syndrome.   OT comments  Patient with better command following this session, but remains confused and needs cues throughout to maintain back precautions during functional tasks.  Patient moving at [facility] level with CGA, and Min A for lower body ADL.  OT updated recommendation to SNF, as patient will benefit from continued inpatient follow up therapy, <3 hours/day.       If plan is discharge home, recommend the following:  Assist for transportation;Assistance with cooking/housework;A lot of help with walking and/or transfers;A lot of help with bathing/dressing/bathroom;Direct supervision/assist for medications management   Equipment Recommendations  None recommended by OT    Recommendations for Other Services      Precautions / Restrictions Precautions Precautions: Fall;Back Precaution Booklet Issued: No Required Braces or Orthoses: Spinal Brace Spinal Brace: Lumbar corset;Applied in sitting position Restrictions Weight Bearing Restrictions: No       Mobility Bed Mobility               General bed mobility comments: up in the recliner    Transfers Overall transfer level: Needs assistance Equipment used: Rolling walker (2 wheels) Transfers: Sit to/from Stand Sit to Stand: Min assist     Step pivot transfers: Contact guard assist     General transfer comment: Assist to power up and for balance     Balance Overall balance assessment:  Needs assistance Sitting-balance support: No upper extremity supported, Feet supported Sitting balance-Leahy Scale: Fair     Standing balance support: Bilateral upper extremity supported Standing balance-Leahy Scale: Poor                             ADL either performed or assessed with clinical judgement   ADL       Grooming: Supervision/safety;Cueing for safety;Standing               Lower Body Dressing: Minimal assistance;Sit to/from stand   Toilet Transfer: Minimal assistance;Rolling walker (2 wheels);Contact guard Actor;Ambulation                  Extremity/Trunk Assessment Upper Extremity Assessment Upper Extremity Assessment: RUE deficits/detail;LUE deficits/detail RUE Coordination: decreased fine motor LUE Coordination: decreased fine motor   Lower Extremity Assessment Lower Extremity Assessment: Defer to PT evaluation   Cervical / Trunk Assessment Cervical / Trunk Assessment: Back Surgery    Vision Patient Visual Report: No change from baseline     Perception Perception Perception: Not tested   Praxis Praxis Praxis: Not tested    Cognition Arousal: Alert Behavior During Therapy: WFL for tasks assessed/performed, Impulsive Overall Cognitive Status: History of cognitive impairments - at baseline                                 General Comments: Pt needs frequent cues to follow back precautions. Cues to don spinal brace.        Exercises      Shoulder Instructions       General Comments  Pertinent Vitals/ Pain       Pain Assessment Pain Assessment: Faces Faces Pain Scale: Hurts a little bit Pain Location: back Pain Descriptors / Indicators: Other (Comment) (Stiff) Pain Intervention(s): Monitored during session                                                          Frequency  Min 1X/week        Progress Toward Goals  OT Goals(current goals can now  be found in the care plan section)  Progress towards OT goals: Progressing toward goals  Acute Rehab OT Goals Time For Goal Achievement: 10/28/22 Potential to Achieve Goals: Good  Plan      Co-evaluation                 AM-PAC OT "6 Clicks" Daily Activity     Outcome Measure   Help from another person eating meals?: None Help from another person taking care of personal grooming?: A Little Help from another person toileting, which includes using toliet, bedpan, or urinal?: A Little Help from another person bathing (including washing, rinsing, drying)?: A Lot Help from another person to put on and taking off regular upper body clothing?: None Help from another person to put on and taking off regular lower body clothing?: A Lot 6 Click Score: 18    End of Session Equipment Utilized During Treatment: Rolling walker (2 wheels);Back brace  OT Visit Diagnosis: Unsteadiness on feet (R26.81);Muscle weakness (generalized) (M62.81);Other symptoms and signs involving cognitive function   Activity Tolerance Patient tolerated treatment well   Patient Left in chair;with call bell/phone within reach;with chair alarm set   Nurse Communication Mobility status        Time: 1610-9604 OT Time Calculation (min): 16 min  Charges: OT General Charges $OT Visit: 1 Visit OT Treatments $Self Care/Home Management : 8-22 mins  10/17/2022  RP, OTR/L  Acute Rehabilitation Services  Office:  [phone]   Suzanna Obey 10/17/2022, 12:52 PM

## 2022-10-17 NOTE — Progress Notes (Addendum)
Mobility Specialist Progress Note   10/17/22 0920  Mobility  Activity Ambulated with assistance in hallway  Level of Assistance Minimal assist, patient does 75% or more  Assistive Device Front wheel walker  Distance Ambulated (ft) 150 ft  Range of Motion/Exercises Active;All extremities  Activity Response Tolerated well   Patient received in supine and agreeable to participate. Completed bed mobility and donned lumbar corset independently. Stood with supervision and ambulated with min A for balance and safety. Returned to room without complaint or incident. Was left in supine with all needs met, call bell in reach.   [place] , BS EXP Mobility Specialist Please contact via SecureChat or Rehab office at [phone]

## 2022-10-17 NOTE — Discharge Summary (Cosign Needed Addendum)
Physician Discharge Summary  Patient ID: Marco Alvarado MRN: [id number] DOB/AGE: [birth year] 77 y.o.  Admit date: 10/13/2022 Discharge date: 10/17/2022  Admission Diagnoses: postoperative pain after spinal fusion    Discharge Diagnoses: same   Discharged Condition: good  Hospital Course: The patient was admitted on 10/13/2022 for pain control. The hospital course was routine. There were no complications. The wound remained clean dry and intact. Pt had appropriate back soreness. No complaints of leg pain or new N/T/W. The patient remained afebrile with stable vital signs, and tolerated a regular diet. The patient continued to increase activities, and pain was well controlled with oral pain medications.   Consults: None  Significant Diagnostic Studies:  Results for orders placed or performed during the hospital encounter of 10/13/22  CBC with Differential  Result Value Ref Range   WBC 11.9 (H) 4.0 - 10.5 K/uL   RBC 4.66 4.22 - 5.81 MIL/uL   Hemoglobin 13.7 13.0 - 17.0 g/dL   HCT 72.5 36.6 - 44.0 %   MCV 88.6 80.0 - 100.0 fL   MCH 29.4 26.0 - 34.0 pg   MCHC 33.2 30.0 - 36.0 g/dL   RDW 34.7 42.5 - 95.6 %   Platelets 259 150 - 400 K/uL   nRBC 0.0 0.0 - 0.2 %   Neutrophils Relative % 78 %   Neutro Abs 9.2 (H) 1.7 - 7.7 K/uL   Lymphocytes Relative 11 %   Lymphs Abs 1.3 0.7 - 4.0 K/uL   Monocytes Relative 10 %   Monocytes Absolute 1.2 (H) 0.1 - 1.0 K/uL   Eosinophils Relative 0 %   Eosinophils Absolute 0.0 0.0 - 0.5 K/uL   Basophils Relative 0 %   Basophils Absolute 0.0 0.0 - 0.1 K/uL   Immature Granulocytes 1 %   Abs Immature Granulocytes 0.07 0.00 - 0.07 K/uL  Comprehensive metabolic panel  Result Value Ref Range   Sodium 133 (L) 135 - 145 mmol/L   Potassium 4.3 3.5 - 5.1 mmol/L   Chloride 101 98 - 111 mmol/L   CO2 21 (L) 22 - 32 mmol/L   Glucose, Bld 111 (H) 70 - 99 mg/dL   BUN 20 8 - 23 mg/dL   Creatinine, Ser 3.87 0.61 - 1.24 mg/dL   Calcium 8.8 (L) 8.9 - 10.3 mg/dL   Total  Protein 6.2 (L) 6.5 - 8.1 g/dL   Albumin 3.3 (L) 3.5 - 5.0 g/dL   AST 41 15 - 41 U/L   ALT 18 0 - 44 U/L   Alkaline Phosphatase 65 38 - 126 U/L   Total Bilirubin 1.4 (H) 0.3 - 1.2 mg/dL   GFR, Estimated >56 >43 mL/min   Anion gap 11 5 - 15  Urinalysis, Routine w reflex microscopic -Urine, Clean Catch  Result Value Ref Range   Color, Urine YELLOW YELLOW   APPearance CLEAR CLEAR   Specific Gravity, Urine 1.016 1.005 - 1.030   pH 6.0 5.0 - 8.0   Glucose, UA NEGATIVE NEGATIVE mg/dL   Hgb urine dipstick SMALL (A) NEGATIVE   Bilirubin Urine NEGATIVE NEGATIVE   Ketones, ur NEGATIVE NEGATIVE mg/dL   Protein, ur 329 (A) NEGATIVE mg/dL   Nitrite NEGATIVE NEGATIVE   Leukocytes,Ua NEGATIVE NEGATIVE   RBC / HPF 0-5 0 - 5 RBC/hpf   WBC, UA 0-5 0 - 5 WBC/hpf   Bacteria, UA NONE SEEN NONE SEEN   Squamous Epithelial / HPF 0-5 0 - 5 /HPF   Mucus PRESENT    Sperm, UA PRESENT  DG Lumbar Spine 2-3 Views  Result Date: 10/14/2022 CLINICAL DATA:  77 year old male status post spine surgery. EXAM: LUMBAR SPINE - 2-3 VIEW COMPARISON:  Intraoperative lumbar images 8224. Preoperative lumbar radiographs 09/18/2022. FINDINGS: AP and lateral views at 0949 hours. Normal lumbar segmentation. New L4-L5 posterior and interbody fusion hardware from last month. Hardware appears intact. Underlying grade 1 spondylolisthesis appears mildly regressed. Elsewhere Stable visualized osseous structures., including mild L1 compression and bulky lower thoracic endplate osteophytes. No acute osseous abnormality identified. Left abdominal calcifications persists suspicious for nephrolithiasis. But underlying aortoiliac vascular calcifications are noted. Nonobstructed bowel-gas pattern. IMPRESSION: 1. New L4-L5 posterior and interbody fusion hardware. No adverse features. No acute osseous abnormality identified. 2. Multiple left abdominal calcifications suspicious for nephrolithiasis. Underlying calcified atherosclerosis.  Electronically Signed   By: Odessa Fleming M.D.   On: 10/14/2022 12:59   DG Lumbar Spine 2-3 Views  Result Date: 10/10/2022 CLINICAL DATA:  Elective surgery. EXAM: LUMBAR SPINE - 2-3 VIEW COMPARISON:  Preoperative radiograph 09/18/2022 FINDINGS: Two fluoroscopic spot views of the lumbar spine obtained in the operating room in frontal and lateral projections. Posterior rod with intrapedicular screw fusion L4-L5 with interbody spacer. Fluoroscopy time 55 seconds. Dose 24.96 mGy. IMPRESSION: Intraoperative fluoroscopy during L4-L5 fusion. Electronically Signed   By: Narda Rutherford M.D.   On: 10/10/2022 16:03   DG C-Arm 1-60 Min-No Report  Result Date: 10/10/2022 Fluoroscopy was utilized by the requesting physician.  No radiographic interpretation.   DG C-Arm 1-60 Min-No Report  Result Date: 10/10/2022 Fluoroscopy was utilized by the requesting physician.  No radiographic interpretation.   DG C-Arm 1-60 Min-No Report  Result Date: 10/10/2022 Fluoroscopy was utilized by the requesting physician.  No radiographic interpretation.    Antibiotics:  Anti-infectives (From admission, onward)    None       Discharge Exam: Blood pressure 119/89, pulse 63, temperature 97.9 F (36.6 C), temperature source Oral, resp. rate 16, height 5\' 7"  (1.702 m), weight 59 kg, SpO2 95%. Neurologic: Grossly normal Ambulating and voiding well, incision cdi, pain controlled  Discharge Medications:   Allergies as of 10/17/2022   No Known Allergies      Medication List     TAKE these medications    carvedilol 3.125 MG tablet Commonly known as: COREG Take 3.125 mg by mouth 2 (two) times daily with a meal.   methocarbamol 500 MG tablet Commonly known as: ROBAXIN Take 1 tablet (500 mg total) by mouth every 6 (six) hours as needed for muscle spasms.   oxyCODONE 5 MG immediate release tablet Commonly known as: Oxy IR/ROXICODONE Take 1 tablet (5 mg total) by mouth every 4 (four) hours as needed for severe pain  ((score 7 to 10)).   sacubitril-valsartan 24-26 MG Commonly known as: ENTRESTO Take 1 tablet by mouth 2 (two) times daily.        Disposition: SNF  Final Dx: postoperative pain after spinal fusion  Discharge Instructions     Call MD for:  difficulty breathing, headache or visual disturbances   Complete by: As directed    Call MD for:  hives   Complete by: As directed    Call MD for:  persistant nausea and vomiting   Complete by: As directed    Call MD for:  redness, tenderness, or signs of infection (pain, swelling, redness, odor or green/yellow discharge around incision site)   Complete by: As directed    Call MD for:  severe uncontrolled pain   Complete by: As directed  Call MD for:  temperature >100.4   Complete by: As directed    Diet - low sodium heart healthy   Complete by: As directed    Driving Restrictions   Complete by: As directed    No driving for 2 weeks, no riding in the car for 1 week   Face-to-face encounter (required for Medicare/Medicaid patients)   Complete by: As directed    I Sherryl Manges certify that this patient is under my care and that I, or a nurse practitioner or physician's assistant working with me, had a face-to-face encounter that meets the physician face-to-face encounter requirements with this patient on 10/17/2022. The encounter with the patient was in whole, or in part for the following medical condition(s) which is the primary reason for home health care (List medical condition): postop lumbar fusion   The encounter with the patient was in whole, or in part, for the following medical condition, which is the primary reason for home health care: postop lumbar fusion   I certify that, based on my findings, the following services are medically necessary home health services: Physical therapy   Reason for Medically Necessary Home Health Services: Therapy- Investment banker, operational, Patent examiner   My clinical findings support  the need for the above services: Pain interferes with ambulation/mobility   Further, I certify that my clinical findings support that this patient is homebound due to: Pain interferes with ambulation/mobility   Home Health   Complete by: As directed    To provide the following care/treatments:  PT Home Health Aide     Increase activity slowly   Complete by: As directed    Lifting restrictions   Complete by: As directed    No lifting more than 8 lbs   No wound care   Complete by: As directed           Signed: Tiana Loft  10/17/2022, 7:40 AM

## 2022-10-17 NOTE — Care Management Important Message (Signed)
Important Message  Patient Details  Name: Jad Mamone MRN: [id number] Date of Birth: 12-Nov-1945   Medicare Important Message Given:  Yes     Sherilyn Banker 10/17/2022, 2:49 PM

## 2022-10-17 NOTE — TOC Progression Note (Addendum)
Transition of Care [facility]) - Progression Note    Patient Details  Name: Marco Alvarado MRN: [id number] Date of Birth: 1945-04-10  Transition of Care [facility] CM/SW Contact  Lorri Frederick, LCSW Phone Number: 10/17/2022, 10:00 AM  Clinical Narrative:   After initially saying he wants to DC home again, pt wife came to hospital, further discussion, pt accepts bed offer at [facility]  CSW discussed transportation with wife and she is able to transport, with assistance getting pt into/out of the car.    CSW confirmed with Michelle/Ashton that they can receive pt today.    1030: Wife can either take pt now or around 3pm.  Per Phineas Semen, waiting on DC for the room to be available, will need for pt to arrive this afternoon.  Plan for wife to transport at 3pm.    No pain medicine hard script, called MD office, they will contact NP.   Expected Discharge Plan: Skilled Nursing Facility Barriers to Discharge: Continued Medical Work up, SNF Pending bed offer  Expected Discharge Plan and Services In-house Referral: Clinical Social Work   Post Acute Care Choice: Skilled Nursing Facility Living arrangements for the past 2 months: Single Family Home Expected Discharge Date: 10/17/22                                     Social Determinants of Health (SDOH) Interventions SDOH Screenings   Food Insecurity: No Food Insecurity (10/14/2022)  Housing: Low Risk  (10/14/2022)  Transportation Needs: No Transportation Needs (10/14/2022)  Utilities: Not At Risk (10/14/2022)  Financial Resource Strain: Low Risk  (10/02/2022)   Received from [facility]  Social Connections: Unknown (07/09/2021)   Received from [facility]  Tobacco Use: Medium Risk (10/13/2022)    Readmission Risk Interventions     No data to display

## 2022-10-17 NOTE — Plan of Care (Signed)
  Problem: Education: Goal: Ability to verbalize activity precautions or restrictions will improve Outcome: Progressing Goal: Understanding of discharge needs will improve Outcome: Progressing   Problem: Activity: Goal: Ability to avoid complications of mobility impairment will improve Outcome: Progressing Goal: Will remain free from falls Outcome: Progressing   Problem: Pain Management: Goal: Pain level will decrease Outcome: Progressing   Problem: Education: Goal: Knowledge of General Education information will improve Description: Including pain rating scale, medication(s)/side effects and non-pharmacologic comfort measures Outcome: Progressing

## 2023-04-01 ENCOUNTER — Emergency Department (HOSPITAL_COMMUNITY): Payer: Medicare Other

## 2023-04-01 ENCOUNTER — Other Ambulatory Visit: Payer: Self-pay

## 2023-04-01 ENCOUNTER — Inpatient Hospital Stay (HOSPITAL_COMMUNITY)
Admission: EM | Admit: 2023-04-01 | Discharge: 2023-04-07 | DRG: 065 | Disposition: A | Discharge status: Skilled Nursing Facility | Payer: Medicare Other | Attending: Family Medicine | Admitting: Family Medicine

## 2023-04-01 DIAGNOSIS — Y92009 Unspecified place in unspecified non-institutional (private) residence as the place of occurrence of the external cause: Secondary | ICD-10-CM

## 2023-04-01 DIAGNOSIS — N179 Acute kidney failure, unspecified: Secondary | ICD-10-CM | POA: Diagnosis present

## 2023-04-01 DIAGNOSIS — R7881 Bacteremia: Secondary | ICD-10-CM

## 2023-04-01 DIAGNOSIS — M24542 Contracture, left hand: Secondary | ICD-10-CM | POA: Diagnosis present

## 2023-04-01 DIAGNOSIS — I447 Left bundle-branch block, unspecified: Secondary | ICD-10-CM | POA: Diagnosis present

## 2023-04-01 DIAGNOSIS — M545 Low back pain, unspecified: Secondary | ICD-10-CM | POA: Diagnosis present

## 2023-04-01 DIAGNOSIS — R413 Other amnesia: Secondary | ICD-10-CM | POA: Insufficient documentation

## 2023-04-01 DIAGNOSIS — R9431 Abnormal electrocardiogram [ECG] [EKG]: Secondary | ICD-10-CM

## 2023-04-01 DIAGNOSIS — Z87442 Personal history of urinary calculi: Secondary | ICD-10-CM

## 2023-04-01 DIAGNOSIS — M21372 Foot drop, left foot: Secondary | ICD-10-CM | POA: Diagnosis present

## 2023-04-01 DIAGNOSIS — I6389 Other cerebral infarction: Principal | ICD-10-CM | POA: Diagnosis present

## 2023-04-01 DIAGNOSIS — R1310 Dysphagia, unspecified: Secondary | ICD-10-CM | POA: Diagnosis present

## 2023-04-01 DIAGNOSIS — G9340 Encephalopathy, unspecified: Secondary | ICD-10-CM | POA: Diagnosis present

## 2023-04-01 DIAGNOSIS — I428 Other cardiomyopathies: Secondary | ICD-10-CM | POA: Diagnosis present

## 2023-04-01 DIAGNOSIS — Z79899 Other long term (current) drug therapy: Secondary | ICD-10-CM

## 2023-04-01 DIAGNOSIS — Z602 Problems related to living alone: Secondary | ICD-10-CM | POA: Diagnosis present

## 2023-04-01 DIAGNOSIS — R7989 Other specified abnormal findings of blood chemistry: Secondary | ICD-10-CM

## 2023-04-01 DIAGNOSIS — E870 Hyperosmolality and hypernatremia: Secondary | ICD-10-CM | POA: Diagnosis present

## 2023-04-01 DIAGNOSIS — Z905 Acquired absence of kidney: Secondary | ICD-10-CM | POA: Diagnosis not present

## 2023-04-01 DIAGNOSIS — I110 Hypertensive heart disease with heart failure: Secondary | ICD-10-CM | POA: Diagnosis present

## 2023-04-01 DIAGNOSIS — Z1152 Encounter for screening for COVID-19: Secondary | ICD-10-CM

## 2023-04-01 DIAGNOSIS — L8990 Pressure ulcer of unspecified site, unspecified stage: Secondary | ICD-10-CM | POA: Insufficient documentation

## 2023-04-01 DIAGNOSIS — R29701 NIHSS score 1: Secondary | ICD-10-CM | POA: Diagnosis present

## 2023-04-01 DIAGNOSIS — W19XXXA Unspecified fall, initial encounter: Secondary | ICD-10-CM | POA: Diagnosis present

## 2023-04-01 DIAGNOSIS — E785 Hyperlipidemia, unspecified: Secondary | ICD-10-CM | POA: Diagnosis present

## 2023-04-01 DIAGNOSIS — R000 Tachycardia, unspecified: Secondary | ICD-10-CM | POA: Diagnosis present

## 2023-04-01 DIAGNOSIS — R32 Unspecified urinary incontinence: Secondary | ICD-10-CM | POA: Diagnosis present

## 2023-04-01 DIAGNOSIS — I509 Heart failure, unspecified: Secondary | ICD-10-CM | POA: Diagnosis not present

## 2023-04-01 DIAGNOSIS — I5022 Chronic systolic (congestive) heart failure: Secondary | ICD-10-CM | POA: Diagnosis present

## 2023-04-01 DIAGNOSIS — Z87891 Personal history of nicotine dependence: Secondary | ICD-10-CM | POA: Diagnosis not present

## 2023-04-01 DIAGNOSIS — M21371 Foot drop, right foot: Secondary | ICD-10-CM | POA: Diagnosis present

## 2023-04-01 DIAGNOSIS — E860 Dehydration: Secondary | ICD-10-CM | POA: Diagnosis present

## 2023-04-01 DIAGNOSIS — G8929 Other chronic pain: Secondary | ICD-10-CM | POA: Diagnosis present

## 2023-04-01 DIAGNOSIS — W1830XA Fall on same level, unspecified, initial encounter: Secondary | ICD-10-CM | POA: Diagnosis present

## 2023-04-01 DIAGNOSIS — I639 Cerebral infarction, unspecified: Secondary | ICD-10-CM | POA: Diagnosis not present

## 2023-04-01 DIAGNOSIS — Z7409 Other reduced mobility: Secondary | ICD-10-CM | POA: Diagnosis present

## 2023-04-01 DIAGNOSIS — L89156 Pressure-induced deep tissue damage of sacral region: Secondary | ICD-10-CM | POA: Diagnosis present

## 2023-04-01 DIAGNOSIS — Z981 Arthrodesis status: Secondary | ICD-10-CM

## 2023-04-01 LAB — COMPREHENSIVE METABOLIC PANEL
ALT: 20 U/L (ref 0–44)
AST: 31 U/L (ref 15–41)
Albumin: 3.3 g/dL — ABNORMAL LOW (ref 3.5–5.0)
Alkaline Phosphatase: 59 U/L (ref 38–126)
Anion gap: 14 (ref 5–15)
BUN: 73 mg/dL — ABNORMAL HIGH (ref 8–23)
CO2: 18 mmol/L — ABNORMAL LOW (ref 22–32)
Calcium: 8.9 mg/dL (ref 8.9–10.3)
Chloride: 112 mmol/L — ABNORMAL HIGH (ref 98–111)
Creatinine, Ser: 1.58 mg/dL — ABNORMAL HIGH (ref 0.61–1.24)
GFR, Estimated: 45 mL/min — ABNORMAL LOW (ref >60)
Glucose, Bld: 128 mg/dL — ABNORMAL HIGH (ref 70–99)
Potassium: 3.6 mmol/L (ref 3.5–5.1)
Sodium: 144 mmol/L (ref 135–145)
Total Bilirubin: 1.3 mg/dL — ABNORMAL HIGH (ref 0.0–1.2)
Total Protein: 6.1 g/dL — ABNORMAL LOW (ref 6.5–8.1)

## 2023-04-01 LAB — RESP PANEL BY RT-PCR (RSV, FLU A&B, COVID)  RVPGX2
Influenza A by PCR: NEGATIVE
Influenza B by PCR: NEGATIVE
Resp Syncytial Virus by PCR: NEGATIVE
SARS Coronavirus 2 by RT PCR: NEGATIVE

## 2023-04-01 LAB — I-STAT VENOUS BLOOD GAS, ED
Acid-base deficit: 3 mmol/L — ABNORMAL HIGH (ref 0.0–2.0)
Bicarbonate: 21.1 mmol/L (ref 20.0–28.0)
Calcium, Ion: 1.21 mmol/L (ref 1.15–1.40)
HCT: 44 % (ref 39.0–52.0)
Hemoglobin: 15 g/dL (ref 13.0–17.0)
O2 Saturation: 48 %
Potassium: 3.9 mmol/L (ref 3.5–5.1)
Sodium: 146 mmol/L — ABNORMAL HIGH (ref 135–145)
TCO2: 22 mmol/L (ref 22–32)
pCO2, Ven: 36.1 mm[Hg] — ABNORMAL LOW (ref 44–60)
pH, Ven: 7.375 (ref 7.25–7.43)
pO2, Ven: 27 mm[Hg] — CL (ref 32–45)

## 2023-04-01 LAB — RAPID URINE DRUG SCREEN, HOSP PERFORMED
Amphetamines: NOT DETECTED
Barbiturates: NOT DETECTED
Benzodiazepines: NOT DETECTED
Cocaine: NOT DETECTED
Opiates: NOT DETECTED
Tetrahydrocannabinol: NOT DETECTED

## 2023-04-01 LAB — I-STAT CHEM 8, ED
BUN: 64 mg/dL — ABNORMAL HIGH (ref 8–23)
Calcium, Ion: 1.19 mmol/L (ref 1.15–1.40)
Chloride: 114 mmol/L — ABNORMAL HIGH (ref 98–111)
Creatinine, Ser: 1.5 mg/dL — ABNORMAL HIGH (ref 0.61–1.24)
Glucose, Bld: 121 mg/dL — ABNORMAL HIGH (ref 70–99)
HCT: 44 % (ref 39.0–52.0)
Hemoglobin: 15 g/dL (ref 13.0–17.0)
Potassium: 3.9 mmol/L (ref 3.5–5.1)
Sodium: 147 mmol/L — ABNORMAL HIGH (ref 135–145)
TCO2: 21 mmol/L — ABNORMAL LOW (ref 22–32)

## 2023-04-01 LAB — CBG MONITORING, ED: Glucose-Capillary: 121 mg/dL — ABNORMAL HIGH (ref 70–99)

## 2023-04-01 LAB — CBC WITH DIFFERENTIAL/PLATELET
Abs Immature Granulocytes: 0.08 10*3/uL — ABNORMAL HIGH (ref 0.00–0.07)
Basophils Absolute: 0 10*3/uL (ref 0.0–0.1)
Basophils Relative: 0 %
Eosinophils Absolute: 0 10*3/uL (ref 0.0–0.5)
Eosinophils Relative: 0 %
HCT: 43.5 % (ref 39.0–52.0)
Hemoglobin: 14.3 g/dL (ref 13.0–17.0)
Immature Granulocytes: 1 %
Lymphocytes Relative: 5 %
Lymphs Abs: 0.8 10*3/uL (ref 0.7–4.0)
MCH: 29.9 pg (ref 26.0–34.0)
MCHC: 32.9 g/dL (ref 30.0–36.0)
MCV: 91 fL (ref 80.0–100.0)
Monocytes Absolute: 1.5 10*3/uL — ABNORMAL HIGH (ref 0.1–1.0)
Monocytes Relative: 9 %
Neutro Abs: 14.3 10*3/uL — ABNORMAL HIGH (ref 1.7–7.7)
Neutrophils Relative %: 85 %
Platelets: 318 10*3/uL (ref 150–400)
RBC: 4.78 MIL/uL (ref 4.22–5.81)
RDW: 13.9 % (ref 11.5–15.5)
WBC: 16.7 10*3/uL — ABNORMAL HIGH (ref 4.0–10.5)
nRBC: 0 % (ref 0.0–0.2)

## 2023-04-01 LAB — URINALYSIS, W/ REFLEX TO CULTURE (INFECTION SUSPECTED)
Bilirubin Urine: NEGATIVE
Glucose, UA: NEGATIVE mg/dL
Ketones, ur: 20 mg/dL — AB
Leukocytes,Ua: NEGATIVE
Nitrite: NEGATIVE
Protein, ur: 30 mg/dL — AB
Specific Gravity, Urine: 1.023 (ref 1.005–1.030)
pH: 5 (ref 5.0–8.0)

## 2023-04-01 LAB — TROPONIN I (HIGH SENSITIVITY)
Troponin I (High Sensitivity): 33 ng/L — ABNORMAL HIGH (ref <18)
Troponin I (High Sensitivity): 35 ng/L — ABNORMAL HIGH (ref <18)

## 2023-04-01 LAB — MAGNESIUM: Magnesium: 2.6 mg/dL — ABNORMAL HIGH (ref 1.7–2.4)

## 2023-04-01 LAB — ETHANOL: Alcohol, Ethyl (B): <10 mg/dL (ref <10)

## 2023-04-01 LAB — CK: Total CK: 439 U/L — ABNORMAL HIGH (ref 49–397)

## 2023-04-01 MED ORDER — LACTATED RINGERS IV BOLUS
1000.0000 mL | Freq: Once | INTRAVENOUS | Status: AC
  Administered 2023-04-01: 1000 mL via INTRAVENOUS

## 2023-04-01 MED ORDER — ENOXAPARIN SODIUM 40 MG/0.4ML IJ SOSY
40.0000 mg | PREFILLED_SYRINGE | Freq: Every day | INTRAMUSCULAR | Status: DC
  Administered 2023-04-02 – 2023-04-07 (×6): 40 mg via SUBCUTANEOUS
  Filled 2023-04-01 (×6): qty 0.4

## 2023-04-01 MED ORDER — ENSURE ENLIVE PO LIQD
237.0000 mL | Freq: Two times a day (BID) | ORAL | Status: DC
  Administered 2023-04-02 – 2023-04-07 (×10): 237 mL via ORAL
  Filled 2023-04-01: qty 237

## 2023-04-01 MED ORDER — SODIUM CHLORIDE 0.9 % IV SOLN: INTRAVENOUS | Status: AC

## 2023-04-01 NOTE — Assessment & Plan Note (Signed)
Broad differential.  Very likely that dehydration is playing a role in the patient's acute encephalopathic changes.  Head imaging is overall reassuring.  Reversible causes ordered.  Delirium and fall precautions.

## 2023-04-01 NOTE — Assessment & Plan Note (Signed)
Likely prerenal, monitor for retaining urine with strict ins and outs.  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  Gentle fluids given history of heart failure.  Already received a bolus

## 2023-04-01 NOTE — ED Triage Notes (Signed)
Pt bibgcems from home. Left wife's house Saturday night, wife hadn't heard from him since then. Wife came over to check on him today and found him on the floor since Saturday covered in body waste. Back is red and raw from being on the floor. Gait issues and weakness with ems. Pt has no complaints of pain. Pt does not remember if he hit his head.   Bp- 100/70 500 cc fluids with ems Hr- 120 Cbg 186

## 2023-04-01 NOTE — ED Notes (Signed)
Called CCMD to place pt on monitor, was told that there is no CCMD at this time and keep an eye on our own pts

## 2023-04-01 NOTE — Hospital Course (Addendum)
Marco Alvarado is a 78 y.o.male with a history of LBBB, mild nonischemic cardiomyopathy, ACDF, lumbar fusion who was admitted to the [facility] at [facility] for fall, found down in his home. His hospital course is detailed below:   Fall:  Patient was found down in his home. Presented to the ED and received head CT that was negative, C-spine CT that was negative, chest x-ray that was overall unremarkable. No obvious abnormalities or notable deformities. Patient ambulates with a walker at baseline. He saw PT and OT while in the hospital who recommended***.   Encephalopathy  Memory Loss:  Patient has been suffering from memory loss and difficulty with ADLs over the last year.  However, he was found to be encephalopathic when he was found down after what appears to be a fall.  CT head negative.  Reversible causes showed negative findings. 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  Pressure ulcer Likely secondary to immobility after a fall for several days.   Bcx Contaminant?  E. Faecalis and Staph Epi each in one bottle, suspected contaminant. No further changes to course  Social Concerns  Unable to continue with staying home alone without assistance. Progressively having more difficulty with ADLs.    Other chronic conditions were medically managed with home medications and formulary alternatives as necessary (NICM, HFmrEF  LBBB, weakness)  PCP Follow-up Recommendations: Patient declines statin for personal reasons. Please reassess.

## 2023-04-01 NOTE — Assessment & Plan Note (Signed)
Patient had T wave inversions that were new in lateral leads.  Patient had no chest pain with tropes trending flat.  Case discussed by ED provider with cards on call.  Did not have concern for immediate cath need or concerns for STEMI.  Will continue to monitor symptomatically.

## 2023-04-01 NOTE — Assessment & Plan Note (Signed)
Chronic over the last year.  The patient has an appointment with neurology outpatient.  Will continue to monitor symptomatically including possibly consulting neurology although he has had great improvement of his encephalopathy based on our exam.

## 2023-04-01 NOTE — ED Provider Notes (Signed)
[place] EMERGENCY DEPARTMENT AT [facility] Provider Note   CSN: [id number] Arrival date & time: 04/01/23  1922     History {Add pertinent medical, surgical, social history, OB history to HPI:1} Chief Complaint  Patient presents with  . Fall    Marco Alvarado is a 78 y.o. male with PMH as listed below who presents with ***.  Pt bibgcems from home. Left wife's house Saturday night, wife hadn't heard from him since then. Wife came over to check on him today and found him on the floor since Saturday covered in body waste. Back is red and raw from being on the floor. Gait issues and generalized weakness with ems. Pt has no complaints of pain. Pt does not remember if he hit his head.  Patient states that he is in the emergency department "because my wife hates me."  He states that he was on the ground "but not the whole time" since Saturday.  When asked several questions, patient seems to just shrug in response or state he does not know.     Bp- 100/70 500 cc fluids with ems Hr- 120 Cbg 186    Past Medical History:  Diagnosis Date  . Arthritis   . Heart murmur    pt has had an echocardiogram 06/26/21  . HFrEF (heart failure with reduced ejection fraction) (HCC)   . History of kidney stones   . White coat syndrome with diagnosis of hypertension        Home Medications Prior to Admission medications   Medication Sig Start Date End Date Taking? Authorizing Provider  carvedilol (COREG) 3.125 MG tablet Take 3.125 mg by mouth 2 (two) times daily with a meal. 08/08/22   [provider]  methocarbamol (ROBAXIN) 500 MG tablet Take 1 tablet (500 mg total) by mouth every 6 (six) hours as needed for muscle spasms. 10/17/22   Meyran, Tiana Loft, NP  oxyCODONE-acetaminophen (PERCOCET/ROXICET) 5-325 MG tablet Take 1 tablet by mouth every 4 (four) hours as needed for severe pain. 10/17/22   Meyran, Tiana Loft, NP  sacubitril-valsartan (ENTRESTO) 24-26 MG Take 1 tablet  by mouth 2 (two) times daily. 08/08/22   [provider]      Allergies    Patient has no known allergies.    Review of Systems   Review of Systems A 10 point review of systems was performed and is negative unless otherwise reported in HPI.  Physical Exam Updated Vital Signs BP (!) 145/75   Pulse (!) 105   Temp 97.7 F (36.5 C) (Oral)   Resp (!) 22   SpO2 100%  Physical Exam General: Normal appearing {Desc; male/male:11659}, lying in bed.  HEENT: PERRLA, Sclera anicteric, MMM, trachea midline.  Cardiology: RRR, no murmurs/rubs/gallops. BL radial and DP pulses equal bilaterally.  Resp: Normal respiratory rate and effort. CTAB, no wheezes, rhonchi, crackles.  Abd: Soft, non-tender, non-distended. No rebound tenderness or guarding.  GU: Deferred. MSK: No peripheral edema or signs of trauma. Extremities without deformity or TTP. No cyanosis or clubbing. Skin: warm, dry. No rashes or lesions. Back: No CVA tenderness Neuro: ***A&Ox4, CNs II-XII grossly intact. MAEs. Sensation grossly intact.  Psych: calm, cooperative     ED Results / Procedures / Treatments   Labs (all labs ordered are listed, but only abnormal results are displayed) Labs Reviewed  CBC WITH DIFFERENTIAL/PLATELET - Abnormal; Notable for the following components:      Result Value   WBC 16.7 (*)    Neutro Abs  14.3 (*)    Monocytes Absolute 1.5 (*)    Abs Immature Granulocytes 0.08 (*)    All other components within normal limits  COMPREHENSIVE METABOLIC PANEL - Abnormal; Notable for the following components:   Chloride 112 (*)    CO2 18 (*)    Glucose, Bld 128 (*)    BUN 73 (*)    Creatinine, Ser 1.58 (*)    Total Protein 6.1 (*)    Albumin 3.3 (*)    Total Bilirubin 1.3 (*)    GFR, Estimated 45 (*)    All other components within normal limits  MAGNESIUM - Abnormal; Notable for the following components:   Magnesium 2.6 (*)    All other components within normal limits  CK - Abnormal;  Notable for the following components:   Total CK 439 (*)    All other components within normal limits  URINALYSIS, W/ REFLEX TO CULTURE (INFECTION SUSPECTED) - Abnormal; Notable for the following components:   Hgb urine dipstick SMALL (*)    Ketones, ur 20 (*)    Protein, ur 30 (*)    Bacteria, UA RARE (*)    All other components within normal limits  I-STAT VENOUS BLOOD GAS, ED - Abnormal; Notable for the following components:   pCO2, Ven 36.1 (*)    pO2, Ven 27 (*)    Acid-base deficit 3.0 (*)    Sodium 146 (*)    All other components within normal limits  I-STAT CHEM 8, ED - Abnormal; Notable for the following components:   Sodium 147 (*)    Chloride 114 (*)    BUN 64 (*)    Creatinine, Ser 1.50 (*)    Glucose, Bld 121 (*)    TCO2 21 (*)    All other components within normal limits  CBG MONITORING, ED - Abnormal; Notable for the following components:   Glucose-Capillary 121 (*)    All other components within normal limits  TROPONIN I (HIGH SENSITIVITY) - Abnormal; Notable for the following components:   Troponin I (High Sensitivity) 33 (*)    All other components within normal limits  RESP PANEL BY RT-PCR (RSV, FLU A&B, COVID)  RVPGX2  ETHANOL  RAPID URINE DRUG SCREEN, HOSP PERFORMED  TROPONIN I (HIGH SENSITIVITY)    EKG None  Radiology CT Head Wo Contrast Result Date: 04/01/2023 CLINICAL DATA:  Head and neck trauma, intoxicated or obtunded, fall EXAM: CT HEAD WITHOUT CONTRAST CT CERVICAL SPINE WITHOUT CONTRAST TECHNIQUE: Multidetector CT imaging of the head and cervical spine was performed following the standard protocol without intravenous contrast. Multiplanar CT image reconstructions of the cervical spine were also generated. RADIATION DOSE REDUCTION: This exam was performed according to the departmental dose-optimization program which includes automated exposure control, adjustment of the mA and/or kV according to patient size and/or use of iterative reconstruction  technique. COMPARISON:  Cervical spine radiographs 09/18/2022 CT HEAD FINDINGS Brain: No intracranial hemorrhage, mass effect, or evidence of acute infarct. No hydrocephalus. No extra-axial fluid collection. Generalized cerebral atrophy. Advanced chronic small vessel ischemic disease. Chronic lacunar infarcts in the bilateral basal ganglia. Vascular: No hyperdense vessel. Intracranial arterial calcification. Skull: No fracture or focal lesion. Sinuses/Orbits: No acute finding. Other: None. CT CERVICAL SPINE FINDINGS Alignment: No evidence of traumatic malalignment. Skull base and vertebrae: No acute fracture. No primary bone lesion or focal pathologic process. Soft tissues and spinal canal: No prevertebral fluid or swelling. No visible canal hematoma. Disc levels: ACDF C4-C7 with interbody spacers. Chronic anterolisthesis of  C7 on T1. Mild multilevel facet arthropathy. No severe spinal canal narrowing. Upper chest: No acute abnormality. Other: Carotid calcification. IMPRESSION: 1. No acute intracranial abnormality. 2. No acute fracture in the cervical spine. Electronically Signed   By: Minerva Fester M.D.   On: 04/01/2023 22:11   CT Cervical Spine Wo Contrast Result Date: 04/01/2023 CLINICAL DATA:  Head and neck trauma, intoxicated or obtunded, fall EXAM: CT HEAD WITHOUT CONTRAST CT CERVICAL SPINE WITHOUT CONTRAST TECHNIQUE: Multidetector CT imaging of the head and cervical spine was performed following the standard protocol without intravenous contrast. Multiplanar CT image reconstructions of the cervical spine were also generated. RADIATION DOSE REDUCTION: This exam was performed according to the departmental dose-optimization program which includes automated exposure control, adjustment of the mA and/or kV according to patient size and/or use of iterative reconstruction technique. COMPARISON:  Cervical spine radiographs 09/18/2022 CT HEAD FINDINGS Brain: No intracranial hemorrhage, mass effect, or evidence of  acute infarct. No hydrocephalus. No extra-axial fluid collection. Generalized cerebral atrophy. Advanced chronic small vessel ischemic disease. Chronic lacunar infarcts in the bilateral basal ganglia. Vascular: No hyperdense vessel. Intracranial arterial calcification. Skull: No fracture or focal lesion. Sinuses/Orbits: No acute finding. Other: None. CT CERVICAL SPINE FINDINGS Alignment: No evidence of traumatic malalignment. Skull base and vertebrae: No acute fracture. No primary bone lesion or focal pathologic process. Soft tissues and spinal canal: No prevertebral fluid or swelling. No visible canal hematoma. Disc levels: ACDF C4-C7 with interbody spacers. Chronic anterolisthesis of C7 on T1. Mild multilevel facet arthropathy. No severe spinal canal narrowing. Upper chest: No acute abnormality. Other: Carotid calcification. IMPRESSION: 1. No acute intracranial abnormality. 2. No acute fracture in the cervical spine. Electronically Signed   By: Minerva Fester M.D.   On: 04/01/2023 22:11   DG Chest Portable 1 View Result Date: 04/01/2023 CLINICAL DATA:  Fall, weakness, down for 5 days ? EXAM: PORTABLE CHEST 1 VIEW COMPARISON:  None Available. FINDINGS: Normal cardiomediastinal silhouette. Aortic atherosclerotic calcification. No focal consolidation, pleural effusion, or pneumothorax. No displaced rib fractures. IMPRESSION: No active disease. Electronically Signed   By: Minerva Fester M.D.   On: 04/01/2023 20:01    Procedures Procedures  {Document cardiac monitor, telemetry assessment procedure when appropriate:1}  Medications Ordered in ED Medications  lactated ringers bolus 1,000 mL (0 mLs Intravenous Stopped 04/01/23 2145)    ED Course/ Medical Decision Making/ A&P                          Medical Decision Making Amount and/or Complexity of Data Reviewed Labs: ordered. Decision-making details documented in ED Course. Radiology: ordered. Decision-making details documented in ED  Course.  Risk Decision regarding hospitalization.    This patient presents to the ED for concern of found down, possible AMS; this involves an extensive number of treatment options, and is a complaint that carries with it a high risk of complications and morbidity.  I considered the following differential and admission for this acute, potentially life threatening condition.   MDM:    Patient answers that he cannot remember or does not know to several of the questions asked.  I am concerned the patient may be altered/confused.   ***Ddx of acute altered mental status or encephalopathy considered but not limited to: -Intracranial abnormalities such as ICH, hydrocephalus, head trauma -Infection such as UTI, PNA, or meningitis -Toxic ingestion such as opioid overdose, anticholinergic toxicity, -Electrolyte abnormalities or hyper/hypoglycemia -Hypercarbia or hypoxia -Hepatic encephalopathy or uremia -ACS  or arrhythmia -Endocrine abnormality such as thyroid storm or myxedema coma   Clinical Course as of 04/01/23 2220  Wed Apr 01, 2023  1949 EKG w/ elevations in anterior leads which seem to be chronic however he does have new ST depressions in lateral leads.  Will discuss with cardiology on STEMI call. [HN]  1953 D/w Dr. Excell Seltzer who believes EKG changes likely d/t clinical scenario and less likely ACS, would not take to cath lab, will get troponin and CTM.  [HN]  2040 Glucose-Capillary(!): 121 [HN]  2040 CK Total(!): 439 [HN]  2041 Sodium(!): 147 Mild hypernatremia [HN]  2041 WBC(!): 16.7 +leukocytosis w/ left shift [HN]  2041 DG Chest Portable 1 View No active disease. [HN]  2041 Creatinine(!): 1.58 +AKI [HN]  2207 Urinalysis, w/ Reflex to Culture (Infection Suspected) -Urine, Clean Catch(!) Neg for infection [HN]  2220 Rapid urine drug screen (hospital performed) neg [HN]    Clinical Course User Index [HN] Loetta Rough, MD    Labs: I Ordered, and personally interpreted  labs.  The pertinent results include:  those listed above  Imaging Studies ordered: I ordered imaging studies including CTH, CT C-spine, CXR I independently visualized and interpreted imaging. I agree with the radiologist interpretation  Additional history obtained from EMS, wife at bedside.    Cardiac Monitoring: .The patient was maintained on a cardiac monitor.  I personally viewed and interpreted the cardiac monitored which showed an underlying rhythm of: sinus tachycardia  Reevaluation: After the interventions noted above, I reevaluated the patient and found that they have :stayed the same  Social Determinants of Health: .Lives independently  Disposition:  Admit to hospitalist  Co morbidities that complicate the patient evaluation . Past Medical History:  Diagnosis Date  . Arthritis   . Heart murmur    pt has had an echocardiogram 06/26/21  . HFrEF (heart failure with reduced ejection fraction) (HCC)   . History of kidney stones   . White coat syndrome with diagnosis of hypertension      Medicines Meds ordered this encounter  Medications  . lactated ringers bolus 1,000 mL    I have reviewed the patients home medicines and have made adjustments as needed  Problem List / ED Course: Problem List Items Addressed This Visit   None Visit Diagnoses       Fall on same level, initial encounter    -  Primary            {Document critical care time when appropriate:1} {Document review of labs and clinical decision tools ie heart score, Chads2Vasc2 etc:1}  {Document your independent review of radiology images, and any outside records:1} {Document your discussion with family members, caretakers, and with consultants:1} {Document social determinants of health affecting pt's care:1} {Document your decision making why or why not admission, treatments were needed:1}  This note was created using dictation software, which may contain spelling or grammatical errors.

## 2023-04-01 NOTE — Assessment & Plan Note (Signed)
Pressure ulcer noted on the back.  This does appear to be secondary to his fall and subsequent laying on the ground for several days.  Ordered wound care.  Continue to monitor for infectious symptoms.

## 2023-04-01 NOTE — H&P (Cosign Needed)
Hospital Admission History and Physical Service Pager: [phone]  Patient name: Marco Alvarado Medical record number: [id number] Date of Birth: September 24, 1945 Age: 78 y.o. Gender: male  Primary Care Provider: Theodis Shove, DO Consultants: None  Code Status: FULL which was confirmed with family if patient unable to confirm   Preferred Emergency Contact:  Contact Information     Name Relation Home Work Mobile   [place] Spouse   [phone]      Other Contacts   None on File      Chief Complaint: Fall  Change in Mentation   Assessment and Plan: Marco Alvarado is a 78 y.o. male presenting with fall, found down at his home after unknown period of time. Additionally with acute encephalopathic changes that have since improved. Differential for presentation of this includes:   Ground Level Fall: 1.  Fall secondary to poor balance.  Seems to be most likely as he has a history of lumbar fusion and residual foot drop bilaterally per wife.  Ambulates with a walker at home.  Does not seem to be doing well at home with difficulty with ADLs by himself. 2.  Metabolic disturbance, Hypercapnia: No evidence of such on lab work.  Glucose normal without severe electrolyte derangement. VBG pH normal, CO2 36. Order TSH  3.  Seizure disorder: Possible but not as likely given lack of this in his history. 4.  Toxin exposure: Carboxyhemoglobin ordered, no other evidence of toxins at present.  UDS ordered as well.  5.  Infectious: Patient does have pressure ulcers/injury on his lower back and upper back.  This seems to be secondary to pressure on his back from fall and subsequent inability to stand or move. Otherwise, no other infectious concerns, HIV and RPR ordered  6.  CVA/TIA: Head imaging unremarkable, no presence of focal neurologic deficits although diffusely weak.  Encephalopathy of Unknown Origin: 1.  Metabolic: No evidence of metabolic disturbance on labs. 2.  Toxic encephalopathy:  UDS ordered with carboxyhemoglobin. Elevated BUN on CMP 3.  Infectious Meningitis Encephalitis: See above 4.  Intracranial hemorrhage head injury: Head imaging reassuring against this 5.  CVA/TIA: See above 6.  Sz Disorder  Post Ictal State  7.  Dehydration in the setting of a fall  Assessment & Plan Fall Ground level fall. See differential above.  Patient seems to have been down for an unknown amount of time, possibly several days.  CK elevated.  No evidence of acute fractures or areas of pain on physical exam. - Admit to med/tele, Dr. Pollie Meyer as attending - AM CBC/BMP/mag/carboxyhemoglobin/HIV/RPR/TSH - Ammonia level - Delirium precautions, fall precautions - OT/PT - Stroke swallow screen - Trend CK - Assistance with feeds Encephalopathy Broad differential.  Very likely that dehydration is playing a role in the patient's acute encephalopathic changes.  Head imaging is overall reassuring.  Reversible causes ordered.  Delirium and fall precautions. Elevated BUN, likely 2/2 AKI and elevated CK.  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  The patient has an appointment with neurology outpatient.  Will continue to monitor symptomatically including possibly consulting neurology although he has had great improvement of his encephalopathy based on our exam. Elevated serum creatinine Likely prerenal, monitor for retaining urine with strict ins and outs.  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  Gentle fluids given history of heart failure.  Already received a bolus Tachycardia Likely in the setting of dehydration.  Unfortunately, the patient was immobile for several days and is currently tachycardic with tachypnea.  Will rule out PE with CTA PE.  Creatinine 1.53.  Pressure ulcer Pressure ulcer noted on the back.  This does appear to be secondary to his fall and subsequent laying on the ground for several days.  Ordered wound care.  Continue to monitor for infectious symptoms.  EKG  abnormalities Patient had T wave inversions that were new in lateral leads.  Patient had no chest pain with tropes trending flat.  Case discussed by ED provider with cards on call.  Did not have concern for immediate cath need or concerns for STEMI.  Will continue to monitor symptomatically.    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 Bcx pending. Deconditioned, PT/OT on board. Anticipate needing rehab facility  --NICM on Entresto and Carvedilol, holding for now  -- HFmrEF  LBBB: Daily weights, Strict I/Os. Bedside echo from 12/2022 with LVEF 45-50%. Sees cardiology with [facility]  --Housing concern worsening weakness: Patient lives alone and is unable to perform ADLs.  Worsening memory loss and difficulty with mobility over the last year.   FEN/GI: NPO, stroke swallow screen ordered  VTE Prophylaxis: Lovenox   Disposition: Med Tele   History of Present Illness:  Marco Alvarado is a 78 y.o. male presenting with a fall/acute on chronic encephalopathic changes.  Wife states that pt was at baseline Saturday, he came to her apartment and was normal. States that she did not see or hear from him after that, so she went to his house today and found him down on the floor, naked, and covered in feces. States his carpets were a mess, and he was disoriented and not making sense.   Pt states that he fell on Saturday. Asked if he was unable to get up and he said "something like that."  Wife does not think he does well living by himself - does not talk to others, just watches TV all day. Also reports worsening memory x1 year, wife made appt with neurologist that he has coming up for his memory loss and decline. Wife denies hx seizures. Denies hx recent illness.  Incontinent at baseline. Walks with a walker and drags his feet  bilaterally.  In the ED, brought in by EMS, CK4 39, troponin 33 > 35, creatinine 1.58, glucose 128, T. bili 1.3, ethanol normal, VBG with pH of 7.375, CBC with white count of 16.7 with neutrophilic shift. Dr. Excell Seltzer for cardiology on call was consulted based on new EKG findings of inversions in the lateral leads- wouldn't take to cath lab in clinical scenario, recommended trending troponins.   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   Pertinent Past Surgical History: Hx nephrectomy  Remainder reviewed in history tab.  Pertinent Social History: Tobacco use: No Alcohol use: No Other Substance use: No Lives alone  Pertinent Family History: None   Remainder reviewed in history tab.   Important Outpatient Medications: Carvedilol - Since June Entresto - Since June Remainder reviewed in medication history.   Objective: BP (!) 145/75   Pulse (!) 105   Temp 97.7 F (36.5 C) (Oral)   Resp (!) 22  SpO2 100%  Exam: General: NAD, sleeping in bed, awakens to voice Eyes: EOMI, PERRLA ENTM: Normal dentition, no abnormalities in throat, external ears or nares  Neck: No LAD Cardiovascular: RRR Respiratory: CTAB with transmitted upper airway sounds Gastrointestinal: Nontender to palpation, soft, nondistended MSK: Slightly contracted in all extremities, diffusely weak with 1-2 out of 5 strength of upper extremities and 1 out of 5 strength of the lower extremities Derm: No acute findings, see photo above for pressure ulcers.  Evidence of superficial skin peeling over the lower spine and gluteal cleft.  Some drainage from the injury.  Redness of the skin over the thoracic spine without ulceration   Neuro: A&O to place, self, month but not year Neuro: CN II: PERRL CN III, IV,VI: EOMI CV V: Normal sensation in V1, V2, V3 CVII: Symmetric smile  CN VIII: Normal hearing CN XI: 5/5 shoulder shrug Normal sensation in UE and LE bilaterally  Psych: Normal affect and mood   Labs:  CBC BMET  Recent Labs  Lab 04/01/23 1937 04/01/23 2017  WBC 16.7*  --   HGB 14.3 15.0  15.0  HCT 43.5 44.0  44.0  PLT 318  --    Recent Labs  Lab 04/01/23 1937 04/01/23 2017  NA 144 146*  147*  K 3.6 3.9  3.9  CL 112* 114*  CO2 18*  --   BUN 73* 64*  CREATININE 1.58* 1.50*  GLUCOSE 128* 121*  CALCIUM 8.9  --     CK of 400s, troponins trended flat, ethanol negative, UDS negative EKG:   Sinus tachycardia, chronic slight T wave elevation in V1, V2, V3.  T wave inversions that are new noted in V5, V6, less so in aVL, artifact present.  Slightly wide QRS complex throughout.   Imaging Studies Performed:  CXR 04/01/23 IMPRESSION: No active disease.  CT Head WO Contrast  Cspine CT IMPRESSION: 1. No acute intracranial abnormality. 2. No acute fracture in the cervical spine.   Alfredo Martinez, MD 04/01/2023, 11:46 PM PGY-3, [facility]  FPTS Intern pager: [phone], text pages welcome Secure chat group [facility] Teaching Service

## 2023-04-01 NOTE — Assessment & Plan Note (Signed)
Likely in the setting of dehydration.  Unfortunately, the patient was immobile for several days and is currently tachycardic with tachypnea.  Will rule out PE with CTA PE.  Creatinine 1.53.

## 2023-04-01 NOTE — Assessment & Plan Note (Signed)
See differential above.  Patient seems to have been down for an unknown amount of time, possibly several days.  CK elevated.  No evidence of acute fractures or areas of pain on physical exam. - Admit to med/tele, Dr. Pollie Meyer as attending - AM CBC/BMP/mag/carboxyhemoglobin/HIV/RPR/TSH - Ammonia level - Delirium precautions, fall precautions - OT/PT - Stroke swallow screen - Trend CK - Assistance with feeds

## 2023-04-02 ENCOUNTER — Inpatient Hospital Stay (HOSPITAL_COMMUNITY): Payer: Medicare Other

## 2023-04-02 ENCOUNTER — Encounter (HOSPITAL_COMMUNITY): Payer: Self-pay | Admitting: Student

## 2023-04-02 DIAGNOSIS — W19XXXA Unspecified fall, initial encounter: Secondary | ICD-10-CM

## 2023-04-02 DIAGNOSIS — I509 Heart failure, unspecified: Secondary | ICD-10-CM

## 2023-04-02 DIAGNOSIS — I639 Cerebral infarction, unspecified: Secondary | ICD-10-CM | POA: Diagnosis not present

## 2023-04-02 LAB — BASIC METABOLIC PANEL
Anion gap: 13 (ref 5–15)
BUN: 60 mg/dL — ABNORMAL HIGH (ref 8–23)
CO2: 20 mmol/L — ABNORMAL LOW (ref 22–32)
Calcium: 8.8 mg/dL — ABNORMAL LOW (ref 8.9–10.3)
Chloride: 109 mmol/L (ref 98–111)
Creatinine, Ser: 1.27 mg/dL — ABNORMAL HIGH (ref 0.61–1.24)
GFR, Estimated: 59 mL/min — ABNORMAL LOW (ref >60)
Glucose, Bld: 140 mg/dL — ABNORMAL HIGH (ref 70–99)
Potassium: 3.8 mmol/L (ref 3.5–5.1)
Sodium: 142 mmol/L (ref 135–145)

## 2023-04-02 LAB — CBC
HCT: 44.4 % (ref 39.0–52.0)
Hemoglobin: 14.8 g/dL (ref 13.0–17.0)
MCH: 30 pg (ref 26.0–34.0)
MCHC: 33.3 g/dL (ref 30.0–36.0)
MCV: 89.9 fL (ref 80.0–100.0)
Platelets: 281 10*3/uL (ref 150–400)
RBC: 4.94 MIL/uL (ref 4.22–5.81)
RDW: 14.1 % (ref 11.5–15.5)
WBC: 12.8 10*3/uL — ABNORMAL HIGH (ref 4.0–10.5)
nRBC: 0 % (ref 0.0–0.2)

## 2023-04-02 LAB — BLOOD CULTURE ID PANEL (REFLEXED) - BCID2

## 2023-04-02 LAB — MAGNESIUM: Magnesium: 2.6 mg/dL — ABNORMAL HIGH (ref 1.7–2.4)

## 2023-04-02 LAB — CARBOXYHEMOGLOBIN - COOX: Carboxyhemoglobin: 2 % — ABNORMAL HIGH (ref 0.5–1.5)

## 2023-04-02 LAB — ECHOCARDIOGRAM COMPLETE
AR max vel: 2.38 cm2
AV Peak grad: 5.5 mm[Hg]
Ao pk vel: 1.17 m/s
Area-P 1/2: 4.8 cm2
S' Lateral: 3.2 cm

## 2023-04-02 LAB — AMMONIA: Ammonia: 22 umol/L (ref 9–35)

## 2023-04-02 LAB — TSH: TSH: 3.287 u[IU]/mL (ref 0.350–4.500)

## 2023-04-02 LAB — CK: Total CK: 350 U/L (ref 49–397)

## 2023-04-02 LAB — RPR: RPR Ser Ql: NONREACTIVE

## 2023-04-02 LAB — HIV ANTIBODY (ROUTINE TESTING W REFLEX): HIV Screen 4th Generation wRfx: NONREACTIVE

## 2023-04-02 MED ORDER — SODIUM CHLORIDE 0.9 % IV SOLN
2.0000 g | Freq: Four times a day (QID) | INTRAVENOUS | Status: DC
  Administered 2023-04-02 – 2023-04-03 (×3): 2 g via INTRAVENOUS
  Filled 2023-04-02 (×5): qty 2000

## 2023-04-02 MED ORDER — IOHEXOL 350 MG/ML SOLN
63.0000 mL | Freq: Once | INTRAVENOUS | Status: AC | PRN
  Administered 2023-04-02: 63 mL via INTRAVENOUS

## 2023-04-02 MED ORDER — GERHARDT'S BUTT CREAM
TOPICAL_CREAM | Freq: Two times a day (BID) | CUTANEOUS | Status: DC
  Filled 2023-04-02: qty 60

## 2023-04-02 MED ORDER — CARVEDILOL 3.125 MG PO TABS
3.1250 mg | ORAL_TABLET | Freq: Two times a day (BID) | ORAL | Status: DC
Start: 2023-04-02 — End: 2023-04-07
  Administered 2023-04-02 – 2023-04-07 (×11): 3.125 mg via ORAL
  Filled 2023-04-02 (×11): qty 1

## 2023-04-02 NOTE — Assessment & Plan Note (Addendum)
Ground level fall. Patient seems to have been down for an unknown amount of time, possibly several days.  Head and C-spine imaging negative.  CK trended down.  Ammonia, TSH normal.  HIV/RPR negative. Passed swallow screen.  --Regular diet - Delirium precautions, fall precautions - OT/PT eval and treat -- AM CBC, BMP, Mg -replete electrolytes as indicated

## 2023-04-02 NOTE — Consult Note (Signed)
WOC Nurse Consult Note: Reason for Consult: Deep tissue injury to back and upper buttocks from fall and being "down" for an extended period of time.   Wound type:Pressure after fall and immobility with likely exposure to moisture and incontinence after fall Pressure Injury POA: Yes Measurement: deep maroon discoloration to sacrum, upper buttocks and posterior waistline.  Erythema to mid back, not as severe Wound bed: red and scant weeping Drainage (amount, consistency, odor) scant weeping  no odor Periwound: intact  Dressing procedure/placement/frequency:  Will not follow at this time.  Please re-consult if needed.  Mike Gip MSN, RN, FNP-BC CWON Wound, Ostomy, Continence Nurse Outpatient [facility] [phone] Pager [phone]

## 2023-04-02 NOTE — Assessment & Plan Note (Addendum)
Elevated to 1.5 on admission, now downtrended to 1.27. Likely prerenal given signs of dehydration on exam. 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 S/p 1 L IVF.  - Strict I/Os - AM BMP

## 2023-04-02 NOTE — Progress Notes (Signed)
CSW received consult for patient for SNF placement.  Patient has traditional Medicare and requires a 3 night stay in inpatient status to be eligible for SNF placement.  CSW completed FL2 - once patient is admitted and full assessment is completed, unit TOC will fax patient's clinical information out to obtain bed offers.  Edwin Dada, MSW, LCSW Transitions of Care  Clinical Social Worker II [phone]

## 2023-04-02 NOTE — Assessment & Plan Note (Addendum)
Chronic over the last year.  Patient has an appointment with neurology outpatient.  Will continue to monitor symptomatically including possibly consulting neurology if patient starts to demonstrate encephalopathic signs again.

## 2023-04-02 NOTE — Progress Notes (Signed)
Echocardiogram 2D Echocardiogram has been performed.  Marco Alvarado 04/02/2023, 3:46 PM

## 2023-04-02 NOTE — Evaluation (Signed)
Physical Therapy Evaluation Patient Details Name: Marco Alvarado MRN: [id number] DOB: Sep 18, 1945 Today's Date: 04/02/2023  History of Present Illness  78 y.o. male presenting with a fall/acute on chronic encephalopathic changes.  PMH includes: ACDF, PLIF, NICM  HFmrEF  LBBB  Worsening memory.  CT Head:No intracranial hemorrhage, mass effect, or evidence of acute  infarct.  Clinical Impression  Pt admitted with above diagnosis. Pt from home alone, wife lives in same apt complex but does not live with him and they have no plans to live together again per pt. She also recently broke her arm per pt. Pt confused on eval and oriented only to self and place. Pt with significantly decreased insight into his limitations and deficits. Pt needed mod A to come to EOB and stand to RW. He ambulated 42' with mod A +2 for safety and multiple LOB posterior with mod A to correct as well as difficulty navigating around obstacles in room. Patient will benefit from continued inpatient follow up therapy, <3 hours/day.  Pt currently with functional limitations due to the deficits listed below (see PT Problem List). Pt will benefit from acute skilled PT to increase their independence and safety with mobility to allow discharge.           If plan is discharge home, recommend the following: A lot of help with walking and/or transfers;A lot of help with bathing/dressing/bathroom;Assistance with cooking/housework;Assist for transportation;Help with stairs or ramp for entrance;Supervision due to cognitive status   Can travel by private vehicle   Yes    Equipment Recommendations None recommended by PT  Recommendations for Other Services       Functional Status Assessment Patient has had a recent decline in their functional status and demonstrates the ability to make significant improvements in function in a reasonable and predictable amount of time.     Precautions / Restrictions Precautions Precautions:  Fall Restrictions Weight Bearing Restrictions Per Provider Order: No      Mobility  Bed Mobility Overal bed mobility: Needs Assistance Bed Mobility: Supine to Sit, Sit to Supine     Supine to sit: Mod assist, +2 for physical assistance Sit to supine: Mod assist, +2 for physical assistance   General bed mobility comments: pt limited by weakness but also by decreased stability as he came to bedside. Mod A needed to trunk    Transfers Overall transfer level: Needs assistance Equipment used: Rolling walker (2 wheels) Transfers: Sit to/from Stand Sit to Stand: +2 physical assistance, Min assist           General transfer comment: mod A for power up and to stabilize. Pt with posterior lean. Stabilizing against bed and then losing balance bkwds once away from bed    Ambulation/Gait Ambulation/Gait assistance: Mod assist, +2 safety/equipment Gait Distance (Feet): 15 Feet Assistive device: Rolling walker (2 wheels) Gait Pattern/deviations: Leaning posteriorly, Staggering right, Staggering left Gait velocity: decreased Gait velocity interpretation: <1.31 ft/sec, indicative of household ambulator   General Gait Details: multiple LOB posterior, pt had difficulty navigating obstacles and making corrections. Also with decreased awareness of his limitations  Stairs            Wheelchair Mobility     Tilt Bed    Modified Rankin (Stroke Patients Only)       Balance Overall balance assessment: Needs assistance Sitting-balance support: Feet unsupported, Bilateral upper extremity supported Sitting balance-Leahy Scale: Fair     Standing balance support: Reliant on assistive device for balance Standing balance-Leahy Scale: Poor Standing  balance comment: unable to prevent loss of balance backwards without min A                             Pertinent Vitals/Pain Pain Assessment Pain Assessment: Faces Faces Pain Scale: Hurts little more Pain Location: L  shoulder and back Pain Descriptors / Indicators: Sore Pain Intervention(s): Monitored during session    Home Living Family/patient expects to be discharged to:: Private residence Living Arrangements: Alone Available Help at Discharge: Family;Available PRN/intermittently Type of Home: Apartment Home Access: Level entry       Home Layout: One level Home Equipment: Shower seat;Cane - single point;Wheelchair - Forensic psychologist (2 wheels);BSC/3in1 Additional Comments: wife lives in a different apt in the same complex. No plans to live together again    Prior Function Prior Level of Function : Independent/Modified Independent;Patient poor historian/Family not available             Mobility Comments: Per patient: ambulated with SPC or RW. ADLs Comments: Per Patient: independent in ADL.  Wife assist with homemaking     Extremity/Trunk Assessment   Upper Extremity Assessment Upper Extremity Assessment: Defer to OT evaluation LUE Deficits / Details: Limited shoulder flexion LUE Sensation: WNL LUE Coordination: WNL    Lower Extremity Assessment Lower Extremity Assessment: Generalized weakness (L quadricep discomfort with SLR, limiting ROM)    Cervical / Trunk Assessment Cervical / Trunk Assessment: Kyphotic  Communication   Communication Communication: No apparent difficulties  Cognition Arousal: Alert Behavior During Therapy: Flat affect Overall Cognitive Status: No family/caregiver present to determine baseline cognitive functioning Area of Impairment: Orientation, Attention, Memory, Following commands, Safety/judgement, Awareness, Problem solving                 Orientation Level: Time, Situation Current Attention Level: Focused Memory: Decreased short-term memory Following Commands: Follows one step commands with increased time Safety/Judgement: Decreased awareness of safety, Decreased awareness of deficits Awareness: Intellectual Problem Solving: Slow  processing, Requires verbal cues, Requires tactile cues General Comments: pt knew he was at [facility] and could state the year but not the day, date, or month. Follows simple commands. Decreased awareness of limitations, reports that he thinks he will be fine going back home alone        General Comments General comments (skin integrity, edema, etc.): BP 149/135 after session. HR 113-121 bpm    Exercises     Assessment/Plan    PT Assessment Patient needs continued PT services  PT Problem List Decreased strength;Decreased activity tolerance;Decreased balance;Decreased mobility;Decreased coordination;Decreased cognition;Decreased safety awareness;Decreased knowledge of precautions;Pain;Cardiopulmonary status limiting activity       PT Treatment Interventions DME instruction;Gait training;Functional mobility training;Therapeutic activities;Therapeutic exercise;Balance training;Neuromuscular re-education;Patient/family education;Cognitive remediation    PT Goals (Current goals can be found in the Care Plan section)  Acute Rehab PT Goals Patient Stated Goal: return home PT Goal Formulation: With patient Time For Goal Achievement: 04/16/23 Potential to Achieve Goals: Good    Frequency Min 1X/week     Co-evaluation PT/OT/SLP Co-Evaluation/Treatment: Yes Reason for Co-Treatment: Complexity of the patient's impairments (multi-system involvement) PT goals addressed during session: Mobility/safety with mobility;Balance;Proper use of DME OT goals addressed during session: ADL's and self-care       AM-PAC PT "6 Clicks" Mobility  Outcome Measure Help needed turning from your back to your side while in a flat bed without using bedrails?: A Little Help needed moving from lying on your back to sitting on the side of  a flat bed without using bedrails?: A Lot Help needed moving to and from a bed to a chair (including a wheelchair)?: A Lot Help needed standing up from a chair using your arms  (e.g., wheelchair or bedside chair)?: Total Help needed to walk in hospital room?: Total Help needed climbing 3-5 steps with a railing? : Total 6 Click Score: 10    End of Session Equipment Utilized During Treatment: Gait belt Activity Tolerance: Patient tolerated treatment well Patient left: in bed;with call bell/phone within reach Nurse Communication: Mobility status PT Visit Diagnosis: Unsteadiness on feet (R26.81);History of falling (Z91.81);Difficulty in walking, not elsewhere classified (R26.2);Muscle weakness (generalized) (M62.81);Pain Pain - Right/Left: Left Pain - part of body: Shoulder (back)    Time: 5621-3086 PT Time Calculation (min) (ACUTE ONLY): 25 min   Charges:   PT Evaluation $PT Eval Moderate Complexity: 1 Mod   PT General Charges $$ ACUTE PT VISIT: 1 Visit         Lyanne Co, PT  Acute Rehab Services Secure chat preferred Office [phone]   [place] L Wynne Jury 04/02/2023, 1:16 PM

## 2023-04-02 NOTE — NC FL2 (Signed)
[place] MEDICAID FL2 LEVEL OF CARE FORM     IDENTIFICATION  Patient Name: Marco Alvarado Birthdate: 1945-08-12 Sex: male Admission Date (Current Location): 04/01/2023  [facility] and [place] Number:  Producer, television/film/video and Address:  [facility], [address] [place], [place] [address]      Provider Number: [id number]  Attending Physician Name and Address:  Latrelle Dodrill, MD  Relative Name and Phone Number:       Current Level of Care: Hospital Recommended Level of Care: Skilled Nursing Facility Prior Approval Number:    Date Approved/Denied:   PASRR Number: [id number]  Discharge Plan:      Current Diagnoses: Patient Active Problem List   Diagnosis Date Noted   Fall 04/01/2023   Encephalopathy 04/01/2023   Memory loss 04/01/2023   Elevated serum creatinine 04/01/2023   Tachycardia 04/01/2023   Pressure ulcer 04/01/2023   EKG abnormalities 04/01/2023   Postoperative pain after spinal surgery 10/13/2022   S/P lumbar fusion 10/10/2022   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    Orientation RESPIRATION BLADDER Height & Weight     Self, Time, Situation, Place  Normal Continent Weight:   Height:     BEHAVIORAL SYMPTOMS/MOOD NEUROLOGICAL BOWEL NUTRITION STATUS      Continent Diet (See discharge summary)  AMBULATORY STATUS COMMUNICATION OF NEEDS Skin   Extensive Assist Verbally Normal                       Personal Care Assistance Level of Assistance  Feeding, Dressing, Bathing Bathing Assistance: Limited assistance Feeding assistance: Independent Dressing Assistance: Limited assistance     Functional Limitations Info  Sight, Hearing, Speech Sight Info: Adequate Hearing Info: Adequate Speech Info: Adequate    SPECIAL CARE FACTORS FREQUENCY  PT (By licensed PT), OT (By licensed OT)     PT Frequency: 5x weekly OT Frequency: 5x weekly            Contractures Contractures Info: Not present    Additional  Factors Info  Code Status, Allergies Code Status Info: Full Code Allergies Info: No known allergies           Current Medications (04/02/2023):  This is the current hospital active medication list Current Facility-Administered Medications  Medication Dose Route Frequency Provider Last Rate Last Admin   carvedilol (COREG) tablet 3.125 mg  3.125 mg Oral BID WC Fatima Blank, Adriana, DO   3.125 mg at 04/02/23 1103   enoxaparin (LOVENOX) injection 40 mg  40 mg Subcutaneous Daily Alfredo Martinez, MD   40 mg at 04/02/23 1205   feeding supplement (ENSURE ENLIVE / ENSURE PLUS) liquid 237 mL  237 mL Oral BID BM Alfredo Martinez, MD   237 mL at 04/02/23 5621   [name]'s butt cream   Topical BID Latrelle Dodrill, MD       Current Outpatient Medications  Medication Sig Dispense Refill   carvedilol (COREG) 3.125 MG tablet Take 3.125 mg by mouth 2 (two) times daily with a meal.     methocarbamol (ROBAXIN) 500 MG tablet Take 1 tablet (500 mg total) by mouth every 6 (six) hours as needed for muscle spasms. 45 tablet 1   sacubitril-valsartan (ENTRESTO) 24-26 MG Take 1 tablet by mouth 2 (two) times daily.       Discharge Medications: Please see discharge summary for a list of discharge medications.  Relevant Imaging Results:  Relevant Lab Results:   Additional Information SSN: [id number]  Norwood Levo  Claudette Laws, LCSW

## 2023-04-02 NOTE — Assessment & Plan Note (Addendum)
Appears resolved at this time.  Patient alert and oriented x 4.  Possibly dehydration contributing to encephalopathic changes. Head imaging is overall reassuring.  Ammonia, TSH normal.  HIV/RPR negative. -CTM -Delirium and fall precautions

## 2023-04-02 NOTE — Plan of Care (Signed)
  Problem: Education: Goal: Knowledge of General Education information will improve Description: Including pain rating scale, medication(s)/side effects and non-pharmacologic comfort measures Outcome: Progressing   Problem: Nutrition: Goal: Adequate nutrition will be maintained Outcome: Progressing   Problem: Coping: Goal: Level of anxiety will decrease Outcome: Progressing   

## 2023-04-02 NOTE — Progress Notes (Signed)
PHARMACY - PHYSICIAN COMMUNICATION CRITICAL VALUE ALERT - BLOOD CULTURE IDENTIFICATION (BCID)    Assessment:  Marco Alvarado is an 78 y.o. male who presented to [facility] on 04/01/2023 following a fall. Noted pressure wounds  to buttocks and lower back. Patient is afebrile with mildly elevated WBC. 1/22 Bcx 1/4 bottles GPCs in chains (ANA)- identified as Enterococcus faecalis without resistance mechanisms on BCID- presumed source pressure wounds.    Calculated CrCl ~41 mL/min   Name of physician (or Provider) Contacted: Levin Erp, MD   Current antibiotics: None   Changes to prescribed antibiotics recommended: START ampicillin 2g IV Q6H  Recommendations accepted by provider  Results for orders placed or performed during the hospital encounter of 04/01/23  Blood Culture ID Panel (Reflexed) (Collected: 04/01/2023 11:21 PM)  Result Value Ref Range   Enterococcus faecalis DETECTED (A) NOT DETECTED   Enterococcus Faecium NOT DETECTED NOT DETECTED   Listeria monocytogenes NOT DETECTED NOT DETECTED   Staphylococcus species NOT DETECTED NOT DETECTED   Staphylococcus aureus (BCID) NOT DETECTED NOT DETECTED   Staphylococcus epidermidis NOT DETECTED NOT DETECTED   Staphylococcus lugdunensis NOT DETECTED NOT DETECTED   Streptococcus species NOT DETECTED NOT DETECTED   Streptococcus agalactiae NOT DETECTED NOT DETECTED   Streptococcus pneumoniae NOT DETECTED NOT DETECTED   Streptococcus pyogenes NOT DETECTED NOT DETECTED   A.calcoaceticus-baumannii NOT DETECTED NOT DETECTED   Bacteroides fragilis NOT DETECTED NOT DETECTED   Enterobacterales NOT DETECTED NOT DETECTED   Enterobacter cloacae complex NOT DETECTED NOT DETECTED   Escherichia coli NOT DETECTED NOT DETECTED   Klebsiella aerogenes NOT DETECTED NOT DETECTED   Klebsiella oxytoca NOT DETECTED NOT DETECTED   Klebsiella pneumoniae NOT DETECTED NOT DETECTED   Proteus species NOT DETECTED NOT DETECTED   Salmonella species NOT DETECTED  NOT DETECTED   Serratia marcescens NOT DETECTED NOT DETECTED   Haemophilus influenzae NOT DETECTED NOT DETECTED   Neisseria meningitidis NOT DETECTED NOT DETECTED   Pseudomonas aeruginosa NOT DETECTED NOT DETECTED   Stenotrophomonas maltophilia NOT DETECTED NOT DETECTED   Candida albicans NOT DETECTED NOT DETECTED   Candida auris NOT DETECTED NOT DETECTED   Candida glabrata NOT DETECTED NOT DETECTED   Candida krusei NOT DETECTED NOT DETECTED   Candida parapsilosis NOT DETECTED NOT DETECTED   Candida tropicalis NOT DETECTED NOT DETECTED   Cryptococcus neoformans/gattii NOT DETECTED NOT DETECTED   Vancomycin resistance NOT DETECTED NOT DETECTED    Jani Gravel, PharmD Clinical Pharmacist  04/02/2023 6:42 PM

## 2023-04-02 NOTE — Assessment & Plan Note (Addendum)
On arrival, patient had T wave inversions that were new in lateral leads. Troponins trended flat.  Case discussed by ED provider with cards on call.  Did not have concern for immediate cath need or concerns for STEMI.   - Continue to monitor symptomatically.

## 2023-04-02 NOTE — Assessment & Plan Note (Addendum)
Pressure ulcer noted on the back.  This does appear to be secondary to his fall and subsequent laying on the ground for several days, with irritation likely secondary to contact with urine/feces.   - Wound care - Continue to monitor for infectious symptoms.

## 2023-04-02 NOTE — Assessment & Plan Note (Addendum)
Likely in the setting of dehydration.  Patient remains tachy to 100-110, though asymptomatic. BP stable.  - Restart home Coreg 3.125 BID

## 2023-04-02 NOTE — Evaluation (Signed)
Occupational Therapy Evaluation Patient Details Name: Marco Alvarado MRN: [id number] DOB: 18-Sep-1945 Today's Date: 04/02/2023   History of Present Illness 78 y.o. male presenting with a fall/acute on chronic encephalopathic changes.  PMH includes: ACDF, PLIF, NICM  HFmrEF  LBBB  Worsening memory.  CT Head:No intracranial hemorrhage, mass effect, or evidence of acute  infarct.   Clinical Impression   Patient admitted for the diagnosis above.  PTA he was living alone in an apartment near his spouse, who checked on him regularly.  Per the chart, patient has been declining in cognitive and functional status.  Unsure what his true baseline is, as he is confused.  Currently he is needing up to Mod A of 2 for mobility and Max A for lower body ADL.  OT will follow in the acute setting to address deficits, and Patient will benefit from continued inpatient follow up therapy, <3 hours/day        If plan is discharge home, recommend the following: A lot of help with bathing/dressing/bathroom;A lot of help with walking and/or transfers;Direct supervision/assist for financial management;Assist for transportation;Assistance with cooking/housework;Direct supervision/assist for medications management    Functional Status Assessment  Patient has had a recent decline in their functional status and demonstrates the ability to make significant improvements in function in a reasonable and predictable amount of time.  Equipment Recommendations  None recommended by OT    Recommendations for Other Services       Precautions / Restrictions Precautions Precautions: Fall Restrictions Weight Bearing Restrictions Per Provider Order: No      Mobility Bed Mobility Overal bed mobility: Needs Assistance Bed Mobility: Supine to Sit, Sit to Supine     Supine to sit: Mod assist, +2 for physical assistance Sit to supine: Mod assist, +2 for physical assistance        Transfers Overall transfer level: Needs  assistance Equipment used: Rolling walker (2 wheels) Transfers: Sit to/from Stand Sit to Stand: +2 physical assistance, Min assist                  Balance Overall balance assessment: Needs assistance Sitting-balance support: Feet unsupported, Bilateral upper extremity supported Sitting balance-Leahy Scale: Fair     Standing balance support: Reliant on assistive device for balance Standing balance-Leahy Scale: Poor Standing balance comment: unable to prevent loss of balance backwards                           ADL either performed or assessed with clinical judgement   ADL Overall ADL's : Needs assistance/impaired Eating/Feeding: Minimal assistance;Bed level   Grooming: Wash/dry hands;Wash/dry face;Supervision/safety;Sitting   Upper Body Bathing: Moderate assistance;Sitting   Lower Body Bathing: Maximal assistance;Sit to/from stand   Upper Body Dressing : Moderate assistance;Sitting   Lower Body Dressing: Maximal assistance;Sit to/from stand   Toilet Transfer: Moderate assistance;Rolling walker (2 wheels);Squat-pivot;BSC/3in1       Tub/ Engineer, structural: Total assistance           Vision Ability to See in Adequate Light: 1 Impaired Patient Visual Report: Blurring of vision       Perception Perception: Not tested       Praxis Praxis: Not tested       Pertinent Vitals/Pain Pain Assessment Pain Assessment: Faces Faces Pain Scale: Hurts little more Pain Location: L shoulder and back Pain Descriptors / Indicators: Sore Pain Intervention(s): Monitored during session     Extremity/Trunk Assessment Upper Extremity Assessment Upper Extremity Assessment: Right hand  dominant;LUE deficits/detail LUE Deficits / Details: Limited shoulder flexion LUE Sensation: WNL LUE Coordination: WNL   Lower Extremity Assessment Lower Extremity Assessment: Defer to PT evaluation   Cervical / Trunk Assessment Cervical / Trunk Assessment: Kyphotic    Communication Communication Communication: No apparent difficulties   Cognition Arousal: Alert Behavior During Therapy: Flat affect Overall Cognitive Status: No family/caregiver present to determine baseline cognitive functioning Area of Impairment: Orientation, Attention, Memory, Following commands, Safety/judgement, Awareness, Problem solving                 Orientation Level: Person, Place Current Attention Level: Focused Memory: Decreased short-term memory Following Commands: Follows one step commands with increased time Safety/Judgement: Decreased awareness of safety, Decreased awareness of deficits Awareness: Intellectual Problem Solving: Slow processing, Requires verbal cues, Requires tactile cues       General Comments   VSS on RA    Exercises     Shoulder Instructions      Home Living Family/patient expects to be discharged to:: Private residence Living Arrangements: Spouse/significant other Available Help at Discharge: Family;Available PRN/intermittently Type of Home: Apartment Home Access: Level entry     Home Layout: One level     Bathroom Shower/Tub: Chief Strategy Officer: Standard Bathroom Accessibility: Yes How Accessible: Accessible via walker Home Equipment: Shower seat;Cane - single point;Wheelchair - Forensic psychologist (2 wheels);BSC/3in1          Prior Functioning/Environment Prior Level of Function : Independent/Modified Independent;Patient poor historian/Family not available             Mobility Comments: Per patient: ambulated with SPC or RW. ADLs Comments: Per Patient: independent in ADL.  Wife assist with homemaking        OT Problem List: Decreased strength;Decreased range of motion;Decreased activity tolerance;Impaired balance (sitting and/or standing);Pain;Decreased safety awareness;Decreased cognition      OT Treatment/Interventions: Self-care/ADL training;Therapeutic activities;Patient/family  education;Balance training;DME and/or AE instruction    OT Goals(Current goals can be found in the care plan section) Acute Rehab OT Goals Patient Stated Goal: Return home OT Goal Formulation: With patient Time For Goal Achievement: 05/14/23 Potential to Achieve Goals: Fair  OT Frequency: Min 1X/week    Co-evaluation PT/OT/SLP Co-Evaluation/Treatment: Yes Reason for Co-Treatment: Complexity of the patient's impairments (multi-system involvement)   OT goals addressed during session: ADL's and self-care      AM-PAC OT "6 Clicks" Daily Activity     Outcome Measure Help from another person eating meals?: A Little Help from another person taking care of personal grooming?: A Little Help from another person toileting, which includes using toliet, bedpan, or urinal?: A Lot Help from another person bathing (including washing, rinsing, drying)?: A Lot Help from another person to put on and taking off regular upper body clothing?: A Lot Help from another person to put on and taking off regular lower body clothing?: A Lot 6 Click Score: 14   End of Session Equipment Utilized During Treatment: Gait belt;Rolling walker (2 wheels) Nurse Communication: Mobility status  Activity Tolerance: Patient tolerated treatment well Patient left: in bed;with call bell/phone within reach  OT Visit Diagnosis: Unsteadiness on feet (R26.81);Repeated falls (R29.6);Muscle weakness (generalized) (M62.81);History of falling (Z91.81);Other symptoms and signs involving cognitive function                Time: 2841-3244 OT Time Calculation (min): 24 min Charges:  OT General Charges $OT Visit: 1 Visit OT Evaluation $OT Eval Moderate Complexity: 1 Mod  04/02/2023  RP, OTR/L  Acute Rehabilitation  Services  Office:  [phone]   Suzanna Obey 04/02/2023, 11:30 AM

## 2023-04-02 NOTE — ED Notes (Signed)
Off the floor to vascular.

## 2023-04-02 NOTE — ED Notes (Signed)
Assumed pt care from [facility]

## 2023-04-02 NOTE — Progress Notes (Addendum)
Daily Progress Note Intern Pager: [phone]  Patient name: Marco Alvarado Medical record number: [id number] Date of birth: July 01, 1945 Age: 78 y.o. Gender: male  Primary Care Provider: Theodis Shove, DO Consultants: None  Code Status: Full  Pt Overview and Major Events to Date:  1/22: Admitted to FMTS  Assessment and Plan:  Marco Alvarado is a 78 y.o. male presenting with fall, found down at his home after unknown period of time. Additionally with acute encephalopathic changes that have since improved.  Fall secondary to balance issues thought to be most likely as he has a history of lumbar fusion and residual foot drop bilaterally per wife. No evidence of metabolic disturbance. Head and Cspine imaging negative.  Assessment & Plan Fall Ground level fall. Patient seems to have been down for an unknown amount of time, possibly several days.  Head and C-spine imaging negative.  CK trended down.  Ammonia, TSH normal.  HIV/RPR negative. Passed swallow screen.  --Regular diet - Delirium precautions, fall precautions - OT/PT eval and treat -- AM CBC, BMP, Mg -replete electrolytes as indicated Encephalopathy Appears resolved at this time.  Patient alert and oriented x 4.  Possibly dehydration contributing to encephalopathic changes. Head imaging is overall reassuring.  Ammonia, TSH normal.  HIV/RPR negative. -CTM -Delirium and fall precautions Memory loss Chronic over the last year.  Patient has an appointment with neurology outpatient.  Will continue to monitor symptomatically including possibly consulting neurology if patient starts to demonstrate encephalopathic signs again. Elevated serum creatinine Elevated to 1.5 on admission, now downtrended to 1.27. Likely prerenal given signs of dehydration on exam. 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 S/p 1 L IVF.  - Strict I/Os - AM BMP Tachycardia Likely in the setting of dehydration.  Patient remains tachy to 100-110, though  asymptomatic. BP stable.  - Restart home Coreg 3.125 BID Pressure ulcer Pressure ulcer noted on the back.  This does appear to be secondary to his fall and subsequent laying on the ground for several days, with irritation likely secondary to contact with urine/feces.   - Wound care - Continue to monitor for infectious symptoms. EKG abnormalities On arrival, patient had T wave inversions that were new in lateral leads. Troponins trended flat.  Case discussed by ED provider with cards on call.  Did not have concern for immediate cath need or concerns for STEMI.   - Continue to monitor symptomatically.  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  --NICM: Resume Coreg, Holding Entresto  -- HFmrEF  LBBB: Daily weights, Strict I/Os. Bedside echo from 12/2022 with LVEF 45-50%. Sees cardiology with [facility].  --Housing concern  worsening weakness: Patient lives alone and is unable to perform ADLs.  Worsening memory loss and difficulty with mobility over the last year.  Pending PT/OT eval.  FEN/GI: Regular diet VTE Prophylaxis: Lovenox  Dispo: Pending PT/OT eval and recommendation for dispo  Subjective:  Patient reports doing fine this morning.  No chest pain or difficulty breathing.  No other pain.  Is hungry.  Objective: Temp:  [97.7 F (36.5 C)-98.1 F (36.7 C)] 98.1 F (36.7 C) (01/22 2348) Pulse Rate:  [97-111] 108 (01/23 0530) Resp:  [13-33] 14 (01/23 0530) BP: (115-161)/(74-85) 116/78 (01/23 0530) SpO2:  [94 %-100 %] 100 % (01/23 0530) Physical Exam: General: Well-appearing. Resting comfortably in room. ENT: Dry, cracked mucous membranes.  CV: Tachycardic S1/S2. No extra heart sounds. Warm and well-perfused. Pulm: Breathing comfortably on room air. CTAB anteriorly. No increased WOB. Abd: Soft, non-tender, non-distended.   Bowel sounds present.  Skin/Ext:  Warm, dry.  No lower extremity edema Psych: Pleasant and appropriate.  Neuro: Alert and oriented to self, place, and time.  Equal sensation across extremities bilaterally.  Chronic decreased strength of left upper and lower extremities.  Normal finger-to-nose exam. CN2: no reported vision changes CN3,4,6: PERRLA. EOMI CN5: Sensation intact BL CN7: Facial expressions symmetric CN8: Hearing intact BL CN9: palate symmetric  CN10: regular speech CN11: turns head against resistance CN12: tongue midline  Laboratory: Most recent CBC Lab Results  Component Value Date   WBC 12.8 (H) 04/02/2023   HGB 14.8 04/02/2023   HCT 44.4 04/02/2023   MCV 89.9 04/02/2023   PLT 281 04/02/2023   Most recent BMP    Latest Ref Rng & Units 04/02/2023    4:05 AM  BMP  Glucose 70 - 99 mg/dL 161   BUN 8 - 23 mg/dL 60   Creatinine 0.96 - 1.24 mg/dL 0.45   Sodium 409 - 811 mmol/L 142   Potassium 3.5 - 5.1 mmol/L 3.8   Chloride 98 - 111 mmol/L 109   CO2 22 - 32 mmol/L 20   Calcium 8.9 - 10.3 mg/dL 8.8    Lumbar XR: No acute findings. CTPE negative   [name], MD 04/02/2023, 7:03 AM  PGY-1, [facility] Family Medicine FPTS Intern pager: [phone], text pages welcome Secure chat group [facility] Teaching Service

## 2023-04-03 ENCOUNTER — Inpatient Hospital Stay (HOSPITAL_COMMUNITY): Payer: Medicare Other

## 2023-04-03 DIAGNOSIS — W1830XA Fall on same level, unspecified, initial encounter: Secondary | ICD-10-CM | POA: Diagnosis not present

## 2023-04-03 DIAGNOSIS — I639 Cerebral infarction, unspecified: Secondary | ICD-10-CM | POA: Diagnosis not present

## 2023-04-03 DIAGNOSIS — R7881 Bacteremia: Secondary | ICD-10-CM

## 2023-04-03 LAB — CBC
HCT: 42.2 % (ref 39.0–52.0)
Hemoglobin: 13.9 g/dL (ref 13.0–17.0)
MCH: 30.1 pg (ref 26.0–34.0)
MCHC: 32.9 g/dL (ref 30.0–36.0)
MCV: 91.3 fL (ref 80.0–100.0)
Platelets: 265 10*3/uL (ref 150–400)
RBC: 4.62 MIL/uL (ref 4.22–5.81)
RDW: 14.5 % (ref 11.5–15.5)
WBC: 9.7 10*3/uL (ref 4.0–10.5)
nRBC: 0 % (ref 0.0–0.2)

## 2023-04-03 LAB — BLOOD CULTURE ID PANEL (REFLEXED) - BCID2

## 2023-04-03 LAB — BASIC METABOLIC PANEL
Anion gap: 9 (ref 5–15)
Anion gap: 9 (ref 5–15)
BUN: 54 mg/dL — ABNORMAL HIGH (ref 8–23)
BUN: 50 mg/dL — ABNORMAL HIGH (ref 8–23)
CO2: 25 mmol/L (ref 22–32)
CO2: 23 mmol/L (ref 22–32)
Calcium: 9.3 mg/dL (ref 8.9–10.3)
Calcium: 9.5 mg/dL (ref 8.9–10.3)
Chloride: 117 mmol/L — ABNORMAL HIGH (ref 98–111)
Chloride: 115 mmol/L — ABNORMAL HIGH (ref 98–111)
Creatinine, Ser: 1.32 mg/dL — ABNORMAL HIGH (ref 0.61–1.24)
Creatinine, Ser: 1.19 mg/dL (ref 0.61–1.24)
GFR, Estimated: 56 mL/min — ABNORMAL LOW (ref >60)
GFR, Estimated: >60 mL/min (ref >60)
Glucose, Bld: 124 mg/dL — ABNORMAL HIGH (ref 70–99)
Glucose, Bld: 140 mg/dL — ABNORMAL HIGH (ref 70–99)
Potassium: 3.9 mmol/L (ref 3.5–5.1)
Potassium: 4 mmol/L (ref 3.5–5.1)
Sodium: 151 mmol/L — ABNORMAL HIGH (ref 135–145)
Sodium: 147 mmol/L — ABNORMAL HIGH (ref 135–145)

## 2023-04-03 LAB — HEMOGLOBIN A1C
Hgb A1c MFr Bld: 5.9 % — ABNORMAL HIGH (ref 4.8–5.6)
Mean Plasma Glucose: 122.63 mg/dL

## 2023-04-03 LAB — OSMOLALITY, URINE: Osmolality, Ur: 873 mosm/kg (ref 300–900)

## 2023-04-03 MED ORDER — STROKE: EARLY STAGES OF RECOVERY BOOK
Freq: Once | Status: AC
  Filled 2023-04-03: qty 1

## 2023-04-03 MED ORDER — GADOBUTROL 1 MMOL/ML IV SOLN
6.0000 mL | Freq: Once | INTRAVENOUS | Status: AC | PRN
  Administered 2023-04-03: 6 mL via INTRAVENOUS

## 2023-04-03 MED ORDER — IOHEXOL 350 MG/ML SOLN
75.0000 mL | Freq: Once | INTRAVENOUS | Status: AC | PRN
  Administered 2023-04-03: 75 mL via INTRAVENOUS

## 2023-04-03 MED ORDER — DEXTROSE 5 % IV SOLN: INTRAVENOUS | Status: AC

## 2023-04-03 MED ORDER — SODIUM CHLORIDE 0.45 % IV SOLN: INTRAVENOUS | Status: DC

## 2023-04-03 NOTE — Evaluation (Signed)
Modified Barium Swallow Study  Patient Details  Name: Marco Alvarado MRN: [id number] Date of Birth: 1945-09-06  Today's Date: 04/03/2023  Modified Barium Swallow completed.  Full report located under Chart Review in the Imaging Section.  History of Present Illness 78 y.o. male presenting with a fall/acute on chronic encephalopathic changes. 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  PMH includes: ACDF, PLIF, NICM  HFmrEF  LBBB  Worsening memory.  CT Head:No intracranial hemorrhage, mass effect, or evidence of acute  infarct.   Clinical Impression Pt has an oropharyngeal dysphagia with impaired timing that contributes to aspiration. Oral phase is marked by disorganized lingual transit and passive spillage of boluses into hte pharynx, occuring in more volume when consuming consecutive boluses of liquids. He has reduced laryngeal vestibule closure and laryngeal elevation, contributing to penetration and aspiration when he becomes more dyscoordinated. Aspiration occurs with thin liquids but x1 (trace) with initial spoonful of nectar thick liquids, and is inconsistently sensed. A chin tuck helped with isolated trials of thin liquids to better contain the bolus away from the airway, but there is still aspiration on subsequent consecutive boluses. Recommend considering regular solids but with nectar thick liquids and ongoing SLP f/u for dysphagia intervention. Also note results from MRI today and recommend ordering SLP cognitive-linguistic evaluation as well.  Factors that may increase risk of adverse event in presence of aspiration Rubye Oaks & Clearance Coots 2021): Reduced cognitive function;Frail or deconditioned;Weak cough  Swallow Evaluation Recommendations Recommendations: PO diet PO Diet Recommendation: Regular;Mildly thick liquids (Level 2, nectar thick) Liquid Administration via: Cup;Straw Medication Administration: Whole meds with puree Supervision: Staff to assist with  self-feeding;Full assist for feeding Swallowing strategies  : Minimize environmental distractions;Slow rate;Small bites/sips (one sip at a time) Postural changes: Position pt fully upright for meals Oral care recommendations: Oral care BID (2x/day) Caregiver Recommendations: Avoid jello, ice cream, thin soups, popsicles;Remove water pitcher      Mahala Menghini., M.A. CCC-SLP Acute Rehabilitation Services Office [phone]  Secure chat preferred  04/03/2023,2:36 PM

## 2023-04-03 NOTE — Discharge Instructions (Signed)
Dear Marco Alvarado,   Thank you so much for allowing [place] to be part of your care!  You were admitted to [facility] for**    POST-HOSPITAL & CARE INSTRUCTIONS  Please let PCP/Specialists know of any changes that were made.  Please see medications section of this packet for any medication changes.   DOCTOR'S APPOINTMENT & FOLLOW UP CARE INSTRUCTIONS  Future Appointments  Date Time Provider Department Center  05/25/2023  2:00 PM Dohmeier, Porfirio Mylar, MD [facility] None    RETURN PRECAUTIONS: - Passing out - Falls - Chest pain - New confusion  Take care and be well!  Family Medicine Teaching Service  [place]  [facility]  [address] [place], [place] [address] [phone]

## 2023-04-03 NOTE — Progress Notes (Signed)
Received a call from CCMD at 2355 that pt had 13 beats of non- sustained Bigeminy PVC at 2246. VS taken and stable, pt was asleep at that time. On call provider notified and aware.

## 2023-04-03 NOTE — Assessment & Plan Note (Addendum)
Ground level fall. Patient seems to have been down for an unknown amount of time, possibly several days.  Head and C-spine imaging negative.  CK trended down.  Ammonia, TSH normal.  HIV/RPR negative. Passed swallow screen.  - Delirium precautions, fall precautions - OT/PT eval and treat -- Pending SNF placement  -- AM CBC, BMP, Mg -replete electrolytes as indicated

## 2023-04-03 NOTE — Evaluation (Signed)
Clinical/Bedside Swallow Evaluation Patient Details  Name: Marco Alvarado MRN: [id number] Date of Birth: 1945-08-17  Today's Date: 04/03/2023 Time: SLP Start Time (ACUTE ONLY): 1254 SLP Stop Time (ACUTE ONLY): 1313 SLP Time Calculation (min) (ACUTE ONLY): 19 min  Past Medical History:  Past Medical History:  Diagnosis Date   Arthritis    Heart murmur    pt has had an echocardiogram 06/26/21   HFrEF (heart failure with reduced ejection fraction) (HCC)    History of kidney stones    White coat syndrome with diagnosis of hypertension    Past Surgical History:  Past Surgical History:  Procedure Laterality Date   ANKLE FRACTURE SURGERY Right    ANTERIOR CERVICAL DECOMP/DISCECTOMY FUSION N/A 08/15/2022   Procedure: Anterior Cervical Decompression Fusion  - Cervical four-Cervical five - Cervical five-Cervical six - Cervical six-Cervical seven;  Surgeon: Arman Bogus, MD;  Location: [facility] OR;  Service: Neurosurgery;  Laterality: N/A;   KIDNEY SURGERY Left    LUMBAR LAMINECTOMY/DECOMPRESSION MICRODISCECTOMY N/A 10/10/2022   Procedure: Laminectomy - Lumbar two-three- Lumbar three-Lumbar four;  Surgeon: Arman Bogus, MD;  Location: [facility] OR;  Service: Neurosurgery;  Laterality: N/A;   TONSILLECTOMY     HPI:  78 y.o. male presenting with a fall/acute on chronic encephalopathic changes.  PMH includes: ACDF, PLIF, NICM  HFmrEF  LBBB  Worsening memory.  CT Head:No intracranial hemorrhage, mass effect, or evidence of acute  infarct.    Assessment / Plan / Recommendation  Clinical Impression  Per nurse, pt has been having intermittent coughing associated with thin liquids. SLP assisted pt with oral care prior to POs given dry oral cavity with dried secretions adhered to hard palate. Coughing is noted fairly consistently with thin liquids. When given small, single sips, he does not cough but he still clears his throat, which he does with pureed consistencies as well. Multiple swallows were noted  across consistencies. Given fluctuating clinical presentation, recommend proceeding with MBS to get a better assessment of oropharyngeal function. It sounds like this can be scheduled with radiology this afternoon, and given that he did pass a swallow screen on previous date, will leave current diet in place pending completion but would encourage full supervision for small, single boluses.    SLP Visit Diagnosis: Dysphagia, unspecified (R13.10)    Aspiration Risk  Mild aspiration risk;Moderate aspiration risk    Diet Recommendation Regular;Thin liquid    Liquid Administration via: Cup;Straw Medication Administration: Whole meds with puree Supervision: Staff to assist with self feeding;Full supervision/cueing for compensatory strategies Compensations: Minimize environmental distractions;Slow rate;Small sips/bites Postural Changes: Seated upright at 90 degrees    Other  Recommendations Oral Care Recommendations: Oral care BID    Recommendations for follow up therapy are one component of a multi-disciplinary discharge planning process, led by the attending physician.  Recommendations may be updated based on patient status, additional functional criteria and insurance authorization.  Follow up Recommendations  (tba)      Assistance Recommended at Discharge    Functional Status Assessment    Frequency and Duration            Prognosis        Swallow Study   General HPI: 78 y.o. male presenting with a fall/acute on chronic encephalopathic changes.  PMH includes: ACDF, PLIF, NICM  HFmrEF  LBBB  Worsening memory.  CT Head:No intracranial hemorrhage, mass effect, or evidence of acute  infarct. Type of Study: Bedside Swallow Evaluation Previous Swallow Assessment: none in chart Diet Prior to  this Study: Regular;Thin liquids (Level 0) Temperature Spikes Noted: No Respiratory Status: Room air History of Recent Intubation: No Behavior/Cognition: Alert;Cooperative;Pleasant mood Oral  Cavity Assessment: Dry;Dried secretions Oral Care Completed by SLP: Yes Oral Cavity - Dentition: Adequate natural dentition Vision: Functional for self-feeding Self-Feeding Abilities: Needs assist Patient Positioning: Upright in bed Baseline Vocal Quality: Normal Volitional Cough: Strong Volitional Swallow: Able to elicit    Oral/Motor/Sensory Function Overall Oral Motor/Sensory Function: Within functional limits   Ice Chips Ice chips: Not tested   Thin Liquid Thin Liquid: Impaired Presentation: Cup;Straw Pharyngeal  Phase Impairments: Cough - Immediate;Throat Clearing - Immediate;Multiple swallows    Nectar Thick Nectar Thick Liquid: Not tested   Honey Thick Honey Thick Liquid: Not tested   Puree Puree: Impaired Presentation: Spoon Pharyngeal Phase Impairments: Multiple swallows;Throat Clearing - Immediate   Solid     Solid: Within functional limits      Mahala Menghini., M.A. CCC-SLP Acute Rehabilitation Services Office [phone]  Secure chat preferred  04/03/2023,1:19 PM

## 2023-04-03 NOTE — Consult Note (Signed)
NEUROLOGY CONSULT NOTE   Date of service: April 03, 2023 Patient Name: Marco Alvarado MRN:  [id number] DOB:  11/08/1945 Chief Complaint: "Fall" Requesting Provider: Latrelle Dodrill, MD  History of Present Illness  Marco Alvarado is a 78 y.o. male with hx of arthritis, heart murmur, heart failure, hypertension, lumbar fusion, memory loss presenting after a fall.  His wife states that he was at his baseline on Saturday when she saw him at her apartment and then he went to his house and she had not heard from him so she went over there on 1/22 and found him on the ground.  When she found him he was disoriented, not making sense he has a pressure sore noted on his back that is likely secondary to his fall.  He has an AKI likely due to dehydration and elevated CK.  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  Neurology was consulted for stroke workup.  LKW: 1/18  NIHSS components Score: Comment  1a Level of Conscious 0[x]  1[]  2[]  3[]      1b LOC Questions 0[]  1[x]  2[]     Incorrect month  1c LOC Commands 0[x]  1[]  2[]       2 Best Gaze 0[x]  1[]  2[]       3 Visual 0[x]  1[]  2[]  3[]      4 Facial Palsy 0[x]  1[]  2[]  3[]      5a Motor Arm - left 0[x]  1[]  2[]  3[]  4[]  UN[]    5b Motor Arm - Right 0[x]  1[]  2[]  3[]  4[]  UN[]    6a Motor Leg - Left 0[]  1[x]  2[]  3[]  4[]  UN[]  Left leg drift  6b Motor Leg - Right 0[x]  1[]  2[]  3[]  4[]  UN[]    7 Limb Ataxia 0[x]  1[]  2[]  3[]  UN[]     8 Sensory 0[x]  1[]  2[]  UN[]      9 Best Language 0[x]  1[]  2[]  3[]      10 Dysarthria 0[x]  1[]  2[]  UN[]      11 Extinct. and Inattention 0[x]  1[]  2[]       TOTAL: 1      ROS  Comprehensive ROS performed and pertinent positives documented in HPI   Past History   Past Medical History:  Diagnosis Date   Arthritis    Heart murmur    pt has had an echocardiogram 06/26/21   HFrEF (heart failure with reduced ejection fraction) (HCC)    History of kidney stones    White coat syndrome with  diagnosis of hypertension     Past Surgical History:  Procedure Laterality Date   ANKLE FRACTURE SURGERY Right    ANTERIOR CERVICAL DECOMP/DISCECTOMY FUSION N/A 08/15/2022   Procedure: Anterior Cervical Decompression Fusion  - Cervical four-Cervical five - Cervical five-Cervical six - Cervical six-Cervical seven;  Surgeon: Arman Bogus, MD;  Location: [facility] OR;  Service: Neurosurgery;  Laterality: N/A;   KIDNEY SURGERY Left    LUMBAR LAMINECTOMY/DECOMPRESSION MICRODISCECTOMY N/A 10/10/2022   Procedure: Laminectomy - Lumbar two-three- Lumbar three-Lumbar four;  Surgeon: Arman Bogus, MD;  Location: [facility] OR;  Service: Neurosurgery;  Laterality: N/A;   TONSILLECTOMY      Family History: No family history on file.  Social History  reports that he quit smoking about 59 years ago. His smoking use included cigarettes. He started smoking about 46 years ago. He has never used smokeless tobacco. He reports that he does not currently use alcohol. He reports that he does not use drugs.  No Known Allergies  Medications  Current Facility-Administered Medications:    carvedilol (COREG) tablet 3.125 mg, 3.125 mg, Oral, BID WC, Gomes, Adriana, DO, 3.125 mg at 04/03/23 0927   dextrose 5 % solution, , Intravenous, Continuous, Bronson, Martin, DO   enoxaparin (LOVENOX) injection 40 mg, 40 mg, Subcutaneous, Daily, Jena Gauss, Allee, MD, 40 mg at 04/03/23 7829   feeding supplement (ENSURE ENLIVE / ENSURE PLUS) liquid 237 mL, 237 mL, Oral, BID BM, Maxwell, Allee, MD, 237 mL at 04/03/23 5621   [name]'s butt cream, , Topical, BID, Latrelle Dodrill, MD, Given at 04/03/23 0927  Vitals   Vitals:   04/03/23 0011 04/03/23 0400 04/03/23 0729 04/03/23 1109  BP: 133/75 129/87 132/87   Pulse: 87 74 86   Resp: 18  16   Temp: 98.1 F (36.7 C) 98.2 F (36.8 C) 97.6 F (36.4 C)   TempSrc:  Oral Oral   SpO2: 98% 99% 98%   Weight:    58.6 kg    Body mass index is 20.22 kg/m.  Physical Exam    Constitutional: Acutely ill  Psych: Tearful Eyes: No scleral injection.  HENT: No OP obstruction.  Head: Normocephalic.  Cardiovascular: Normal rate and regular rhythm.  Respiratory: Effort normal, non-labored breathing.  GI: Soft.  No distension. There is no tenderness.  Skin: erythema to legs and feet   Neurologic Examination   Neuro: Mental Status: Patient is awake, alert, oriented to person, place, year, situation.  States month is February, remembers rolling out of bed and landing on the floor. Patient is able to give a clear and coherent history. No signs of aphasia or neglect Cranial Nerves: II: Visual Fields are full. Pupils are equal, round, and reactive to light.   III,IV, VI: EOMI without ptosis or diploplia.  V: Facial sensation is symmetric to temperature VII: Facial movement is symmetric resting and smiling VIII: Hearing is intact to voice X: Palate elevates symmetrically XI: Shoulder shrug is symmetric. XII: Tongue protrudes midline without atrophy or fasciculations.  Motor: Tone is normal. Bulk is poor Left leg drift.  Left hand is contracture, patient states this is baseline Bilateral lower extremity weakness, increased effort needed to lift leg.  Generalized weakness Sensory: Sensation is symmetric to light touch and temperature in the arms and legs. No extinction to DSS present.  Cerebellar: FNF and HKS are intact bilaterally   Labs/Imaging/Neurodiagnostic studies   CBC:  Recent Labs  Lab April 10, 2023 1937 Apr 10, 2023 2017 04/02/23 0405 04/03/23 0623  WBC 16.7*  --  12.8* 9.7  NEUTROABS 14.3*  --   --   --   HGB 14.3   < > 14.8 13.9  HCT 43.5   < > 44.4 42.2  MCV 91.0  --  89.9 91.3  PLT 318  --  281 265   < > = values in this interval not displayed.   Basic Metabolic Panel:  Lab Results  Component Value Date   NA 151 (H) 04/03/2023   K 3.9 04/03/2023   CO2 25 04/03/2023   GLUCOSE 124 (H) 04/03/2023   BUN 54 (H) 04/03/2023   CREATININE  1.32 (H) 04/03/2023   CALCIUM 9.3 04/03/2023   GFRNONAA 56 (L) 04/03/2023   Lipid Panel: No results found for: "LDLCALC" HgbA1c: No results found for: "HGBA1C" Urine Drug Screen:     Component Value Date/Time   LABOPIA NONE DETECTED 04-10-23 2025   COCAINSCRNUR NONE DETECTED 10-Apr-2023 2025   LABBENZ NONE DETECTED April 10, 2023 2025   AMPHETMU NONE DETECTED 04-10-2023 2025   THCU NONE  DETECTED 04/01/2023 2025   LABBARB NONE DETECTED 04/01/2023 2025    Alcohol Level     Component Value Date/Time   ETH <10 04/01/2023 1949   INR  Lab Results  Component Value Date   INR 0.9 10/01/2022   APTT No results found for: "APTT" AED levels: No results found for: "PHENYTOIN", "ZONISAMIDE", "LAMOTRIGINE", "LEVETIRACETA"  CT angio Head and Neck with contrast(Personally reviewed):   MRI Brain(Personally reviewed): 3.3 x 3.0 cm acute infarct within the right corona radiata and right basal ganglia. Background parenchymal atrophy, advanced chronic small vessel ischemic disease and chronic infarcts, as described. 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  Echocardiogram-EF 55-60% with grade 1 diastolic dysfunction, left atrium is mildly dilated  ASSESSMENT   Marco Alvarado is a 78 y.o. male  has a past medical history of Arthritis, Heart murmur, HFrEF (heart failure with reduced ejection fraction) (HCC), History of kidney stones, and White coat syndrome with diagnosis of hypertension.  He is initially presenting after a fall with a prolonged time on the floor.  MRI brain does show an acute infarct in the right corona radiata and basal ganglia.  RECOMMENDATIONS   # Right basal ganglia infarct  - Stroke labs TSH, ESR, RPR, HgbA1c, fasting lipid panel - MRI brain  - CTA pending - Frequent neuro checks - Prophylactic therapy-Antiplatelet med: Aspirin 81 mg daily with Plavix 75 mg  daily; duration to be determined after vessel imaging is completed - Risk factor modification - Telemetry monitoring; 30 day event monitor on discharge if no arrythmias captured  - Blood pressure goal   - Normotension - PT consult, OT consult, Speech consult, unless patient is back to baseline - Stroke team to follow  ______________________________________________________________________    Signed, Gordy Councilman, MD [facility]

## 2023-04-03 NOTE — Assessment & Plan Note (Signed)
Pressure ulcer noted on the back.  This does appear to be secondary to his fall and subsequent laying on the ground for several days, with irritation likely secondary to contact with urine/feces.   - Wound care - Continue to monitor for infectious symptoms.

## 2023-04-03 NOTE — Assessment & Plan Note (Signed)
On arrival, patient had T wave inversions that were new in lateral leads. Troponins trended flat.  Case discussed by ED provider with cards on call.  Did not have concern for immediate cath need or concerns for STEMI.   - Continue to monitor symptomatically.

## 2023-04-03 NOTE — Consult Note (Signed)
[facility]       Reason for Consult: positive blood cultures    Referring Physician: CHAMP autoconsult  Principal Problem:   Fall Active Problems:   Encephalopathy   Memory loss   Elevated serum creatinine   Pressure ulcer   EKG abnormalities    carvedilol  3.125 mg Oral BID WC   enoxaparin (LOVENOX) injection  40 mg Subcutaneous Daily   feeding supplement  237 mL Oral BID BM   [name]'s butt cream   Topical BID    Recommendations: Will stop antibiotics  Assessment: Patient is here for a fall and does have a rash on his backside but no particular open lesions and blood cultures most consistent with a contaminant and then 1 set.  With no fever or other concerns for infection, do not recommend any antibiotics.  I otherwise will sign off, please call with questions  HPI: Marco Alvarado is a 78 y.o. male here following a fall at home and now with a positive blood culture with staph epidermidis and Enterococcus faecalis in 1 blood culture set.  Initial WBC of 12.8 and normalized to 9.7.  He is otherwise afebrile.  History is limited with the patient as he does not talk much.  Review of the history notes cognitive decline suspected over the last year.  He lives alone but has a wife who apparently comes by and helps him.  Review of Systems:  Constitutional: negative for fevers and chills All other systems reviewed and are negative    Past Medical History:  Diagnosis Date   Arthritis    Heart murmur    pt has had an echocardiogram 06/26/21   HFrEF (heart failure with reduced ejection fraction) (HCC)    History of kidney stones    White coat syndrome with diagnosis of hypertension     Social History   Tobacco Use   Smoking status: Former    Types: Cigarettes    Start date: 1979    Quit date: 1966    Years since quitting: 59.1   Smokeless tobacco: Never  Vaping Use   Vaping status: Never Used  Substance Use Topics   Alcohol use: Not  Currently   Drug use: Never    No family history on file.  No Known Allergies  Physical Exam: Constitutional: in no apparent distress  Vitals:   04/03/23 0400 04/03/23 0729  BP: 129/87 132/87  Pulse: 74 86  Resp:  16  Temp: 98.2 F (36.8 C) 97.6 F (36.4 C)  SpO2: 99% 98%   EYES: anicteric Respiratory: Normal respiratory effort  Lab Results  Component Value Date   WBC 9.7 04/03/2023   HGB 13.9 04/03/2023   HCT 42.2 04/03/2023   MCV 91.3 04/03/2023   PLT 265 04/03/2023    Lab Results  Component Value Date   CREATININE 1.32 (H) 04/03/2023   BUN 54 (H) 04/03/2023   NA 151 (H) 04/03/2023   K 3.9 04/03/2023   CL 117 (H) 04/03/2023   CO2 25 04/03/2023    Lab Results  Component Value Date   ALT 20 04/01/2023   AST 31 04/01/2023   ALKPHOS 59 04/01/2023     Microbiology: Recent Results (from the past 240 hours)  Resp panel by RT-PCR (RSV, Flu A&B, Covid) Anterior Nasal Swab     Status: None   Collection Time: 04/01/23  7:37 PM   Specimen: Anterior Nasal Swab  Result Value Ref Range Status   SARS Coronavirus  2 by RT PCR NEGATIVE NEGATIVE Final   Influenza A by PCR NEGATIVE NEGATIVE Final   Influenza B by PCR NEGATIVE NEGATIVE Final    Comment: (NOTE) The Xpert Xpress SARS-CoV-2/FLU/RSV plus assay is intended as an aid in the diagnosis of influenza from Nasopharyngeal swab specimens and should not be used as a sole basis for treatment. Nasal washings and aspirates are unacceptable for Xpert Xpress SARS-CoV-2/FLU/RSV testing.  Fact Sheet for Patients: BloggerCourse.com  Fact Sheet for Healthcare Providers: SeriousBroker.it  This test is not yet approved or cleared by the [place] FDA and has been authorized for detection and/or diagnosis of SARS-CoV-2 by FDA under an Emergency Use Authorization (EUA). This EUA will remain in effect (meaning this test can be used) for the duration of the COVID-19  declaration under Section 564(b)(1) of the Act, 21 U.S.C. section 360bbb-3(b)(1), unless the authorization is terminated or revoked.     Resp Syncytial Virus by PCR NEGATIVE NEGATIVE Final    Comment: (NOTE) Fact Sheet for Patients: BloggerCourse.com  Fact Sheet for Healthcare Providers: SeriousBroker.it  This test is not yet approved or cleared by the [place] FDA and has been authorized for detection and/or diagnosis of SARS-CoV-2 by FDA under an Emergency Use Authorization (EUA). This EUA will remain in effect (meaning this test can be used) for the duration of the COVID-19 declaration under Section 564(b)(1) of the Act, 21 U.S.C. section 360bbb-3(b)(1), unless the authorization is terminated or revoked.  Performed at [facility], [address], [place], [place] [address]   Blood Culture (Routine X 2)     Status: Abnormal (Preliminary result)   Collection Time: 04/01/23 11:21 PM   Specimen: BLOOD  Result Value Ref Range Status   Specimen Description BLOOD RIGHT ANTECUBITAL  Final   Special Requests   Final    BOTTLES DRAWN AEROBIC AND ANAEROBIC Blood Culture adequate volume   Culture  Setup Time   Final    GRAM POSITIVE COCCI IN CHAINS ANAEROBIC BOTTLE ONLY CRITICAL RESULT CALLED TO, READ BACK BY AND VERIFIED WITH: PHARMD AUSTIN PAYTES ON 04/02/23 @ 1837 BY DRT GRAM POSITIVE COCCI IN CLUSTERS AEROBIC BOTTLE ONLY CRITICAL RESULT CALLED TO, READ BACK BY AND VERIFIED WITH: PHARMD G ABBOTT 04/03/2023 @ 0225 BY AB    Culture (A)  Final    ENTEROCOCCUS FAECALIS SUSCEPTIBILITIES TO FOLLOW Performed at [facility], [address], [place], [place] [address]    Report Status PENDING  Incomplete  Blood Culture ID Panel (Reflexed)     Status: Abnormal   Collection Time: 04/01/23 11:21 PM  Result Value Ref Range Status   Enterococcus faecalis DETECTED (A) NOT DETECTED Final    Comment: CRITICAL RESULT CALLED  TO, READ BACK BY AND VERIFIED WITH: PHARMD AUSTIN PAYTES ON 04/02/23 @ 1837 BY DRT    Enterococcus Faecium NOT DETECTED NOT DETECTED Final   Listeria monocytogenes NOT DETECTED NOT DETECTED Final   Staphylococcus species NOT DETECTED NOT DETECTED Final   Staphylococcus aureus (BCID) NOT DETECTED NOT DETECTED Final   Staphylococcus epidermidis NOT DETECTED NOT DETECTED Final   Staphylococcus lugdunensis NOT DETECTED NOT DETECTED Final   Streptococcus species NOT DETECTED NOT DETECTED Final   Streptococcus agalactiae NOT DETECTED NOT DETECTED Final   Streptococcus pneumoniae NOT DETECTED NOT DETECTED Final   Streptococcus pyogenes NOT DETECTED NOT DETECTED Final   A.calcoaceticus-baumannii NOT DETECTED NOT DETECTED Final   Bacteroides fragilis NOT DETECTED NOT DETECTED Final   Enterobacterales NOT DETECTED NOT DETECTED  Final   Enterobacter cloacae complex NOT DETECTED NOT DETECTED Final   Escherichia coli NOT DETECTED NOT DETECTED Final   Klebsiella aerogenes NOT DETECTED NOT DETECTED Final   Klebsiella oxytoca NOT DETECTED NOT DETECTED Final   Klebsiella pneumoniae NOT DETECTED NOT DETECTED Final   Proteus species NOT DETECTED NOT DETECTED Final   Salmonella species NOT DETECTED NOT DETECTED Final   Serratia marcescens NOT DETECTED NOT DETECTED Final   Haemophilus influenzae NOT DETECTED NOT DETECTED Final   Neisseria meningitidis NOT DETECTED NOT DETECTED Final   Pseudomonas aeruginosa NOT DETECTED NOT DETECTED Final   Stenotrophomonas maltophilia NOT DETECTED NOT DETECTED Final   Candida albicans NOT DETECTED NOT DETECTED Final   Candida auris NOT DETECTED NOT DETECTED Final   Candida glabrata NOT DETECTED NOT DETECTED Final   Candida krusei NOT DETECTED NOT DETECTED Final   Candida parapsilosis NOT DETECTED NOT DETECTED Final   Candida tropicalis NOT DETECTED NOT DETECTED Final   Cryptococcus neoformans/gattii NOT DETECTED NOT DETECTED Final   Vancomycin resistance NOT DETECTED  NOT DETECTED Final    Comment: Performed at [facility], [address] [place], [place] [address]  Blood Culture (Routine X 2)     Status: None (Preliminary result)   Collection Time: 04/01/23 11:27 PM   Specimen: BLOOD RIGHT HAND  Result Value Ref Range Status   Specimen Description BLOOD RIGHT HAND  Final   Special Requests   Final    BOTTLES DRAWN AEROBIC AND ANAEROBIC Blood Culture adequate volume   Culture   Final    NO GROWTH 2 DAYS Performed at [facility], [address], [place], [place] [address]    Report Status PENDING  Incomplete  Blood Culture ID Panel (Reflexed)     Status: Abnormal   Collection Time: 04/03/23  2:00 AM  Result Value Ref Range Status   Enterococcus faecalis NOT DETECTED NOT DETECTED Final   Enterococcus Faecium NOT DETECTED NOT DETECTED Final   Listeria monocytogenes NOT DETECTED NOT DETECTED Final   Staphylococcus species DETECTED (A) NOT DETECTED Final    Comment: CRITICAL RESULT CALLED TO, READ BACK BY AND VERIFIED WITH: PHARMD G ABBOTT 04/03/2023 @ 0225 BY AB    Staphylococcus aureus (BCID) NOT DETECTED NOT DETECTED Final   Staphylococcus epidermidis DETECTED (A) NOT DETECTED Final    Comment: CRITICAL RESULT CALLED TO, READ BACK BY AND VERIFIED WITH: PHARMD G ABBOTT 04/03/2023 @ 0225 BY AB    Staphylococcus lugdunensis NOT DETECTED NOT DETECTED Final   Streptococcus species NOT DETECTED NOT DETECTED Final   Streptococcus agalactiae NOT DETECTED NOT DETECTED Final   Streptococcus pneumoniae NOT DETECTED NOT DETECTED Final   Streptococcus pyogenes NOT DETECTED NOT DETECTED Final   A.calcoaceticus-baumannii NOT DETECTED NOT DETECTED Final   Bacteroides fragilis NOT DETECTED NOT DETECTED Final   Enterobacterales NOT DETECTED NOT DETECTED Final   Enterobacter cloacae complex NOT DETECTED NOT DETECTED Final   Escherichia coli NOT DETECTED NOT DETECTED Final   Klebsiella aerogenes NOT DETECTED NOT DETECTED Final   Klebsiella oxytoca  NOT DETECTED NOT DETECTED Final   Klebsiella pneumoniae NOT DETECTED NOT DETECTED Final   Proteus species NOT DETECTED NOT DETECTED Final   Salmonella species NOT DETECTED NOT DETECTED Final   Serratia marcescens NOT DETECTED NOT DETECTED Final   Haemophilus influenzae NOT DETECTED NOT DETECTED Final   Neisseria meningitidis NOT DETECTED NOT DETECTED Final   Pseudomonas aeruginosa NOT DETECTED NOT DETECTED Final   Stenotrophomonas maltophilia NOT DETECTED NOT  DETECTED Final   Candida albicans NOT DETECTED NOT DETECTED Final   Candida auris NOT DETECTED NOT DETECTED Final   Candida glabrata NOT DETECTED NOT DETECTED Final   Candida krusei NOT DETECTED NOT DETECTED Final   Candida parapsilosis NOT DETECTED NOT DETECTED Final   Candida tropicalis NOT DETECTED NOT DETECTED Final   Cryptococcus neoformans/gattii NOT DETECTED NOT DETECTED Final   Methicillin resistance mecA/C NOT DETECTED NOT DETECTED Final    Comment: Performed at [facility], [address] [place], [place] [address]    Gardiner Barefoot, MD [facility] [email or web address] 04/03/2023, 12:45 PM

## 2023-04-03 NOTE — Assessment & Plan Note (Signed)
Blood cultures positive for E faecalis and Staph epi, from two different bottles.  - Continue Amoxicillin  - Appreciate ID recs (automatically consulted)

## 2023-04-03 NOTE — Assessment & Plan Note (Addendum)
Elevated to 1.5 on admission, now downtrended to 1.3. Likely prerenal given signs of dehydration on exam. 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 S/p 1 L IVF, remains dehydrated on exam. Hypernatremic this morning Na 151.  - Maintenance 12mL/hr D5water.  - Urine Osms - Strict I/Os - AM BMP

## 2023-04-03 NOTE — Progress Notes (Signed)
PHARMACY - PHYSICIAN COMMUNICATION CRITICAL VALUE ALERT - BLOOD CULTURE IDENTIFICATION (BCID)  Assessment:  Marco Alvarado is an 78 y.o. male who presented to [facility] on 04/01/2023 following a fall. Noted pressure wounds  to buttocks and lower back.  Previously blood cultures reported growing Enterococcus, now reporting methicillin sensitive Staphylococcus epidermidis as well.    Name of physician (or Provider) Contacted:  Dr. Antonietta Barcelona, PharmD to discuss on rounds  Current antibiotics:  Ampicillin  Changes to prescribed antibiotics recommended:  Consider broadening to Unasyn for improved MSSE coverage if needed  Results for orders placed or performed during the hospital encounter of 04/01/23  Blood Culture ID Panel (Reflexed) (Collected: 04/03/2023  2:00 AM)  Result Value Ref Range   Enterococcus faecalis NOT DETECTED NOT DETECTED   Enterococcus Faecium NOT DETECTED NOT DETECTED   Listeria monocytogenes NOT DETECTED NOT DETECTED   Staphylococcus species DETECTED (A) NOT DETECTED   Staphylococcus aureus (BCID) NOT DETECTED NOT DETECTED   Staphylococcus epidermidis DETECTED (A) NOT DETECTED   Staphylococcus lugdunensis NOT DETECTED NOT DETECTED   Streptococcus species NOT DETECTED NOT DETECTED   Streptococcus agalactiae NOT DETECTED NOT DETECTED   Streptococcus pneumoniae NOT DETECTED NOT DETECTED   Streptococcus pyogenes NOT DETECTED NOT DETECTED   A.calcoaceticus-baumannii NOT DETECTED NOT DETECTED   Bacteroides fragilis NOT DETECTED NOT DETECTED   Enterobacterales NOT DETECTED NOT DETECTED   Enterobacter cloacae complex NOT DETECTED NOT DETECTED   Escherichia coli NOT DETECTED NOT DETECTED   Klebsiella aerogenes NOT DETECTED NOT DETECTED   Klebsiella oxytoca NOT DETECTED NOT DETECTED   Klebsiella pneumoniae NOT DETECTED NOT DETECTED   Proteus species NOT DETECTED NOT DETECTED   Salmonella species NOT DETECTED NOT DETECTED   Serratia marcescens NOT DETECTED NOT DETECTED    Haemophilus influenzae NOT DETECTED NOT DETECTED   Neisseria meningitidis NOT DETECTED NOT DETECTED   Pseudomonas aeruginosa NOT DETECTED NOT DETECTED   Stenotrophomonas maltophilia NOT DETECTED NOT DETECTED   Candida albicans NOT DETECTED NOT DETECTED   Candida auris NOT DETECTED NOT DETECTED   Candida glabrata NOT DETECTED NOT DETECTED   Candida krusei NOT DETECTED NOT DETECTED   Candida parapsilosis NOT DETECTED NOT DETECTED   Candida tropicalis NOT DETECTED NOT DETECTED   Cryptococcus neoformans/gattii NOT DETECTED NOT DETECTED   Methicillin resistance mecA/C NOT DETECTED NOT DETECTED    Eddie Candle 04/03/2023  3:05 AM

## 2023-04-03 NOTE — Consult Note (Incomplete)
NEUROLOGY CONSULT NOTE   Date of service: April 03, 2023 Patient Name: Marco Alvarado MRN:  [id number] DOB:  05-Sep-1945 Chief Complaint: "***" Requesting Provider: Latrelle Dodrill, MD  History of Present Illness  Marco Alvarado is a 78 y.o. male with hx of ***     ROS  ***Comprehensive ROS performed and pertinent positives documented in HPI  ***Unable to ascertain due to ***  Past History   Past Medical History:  Diagnosis Date   Arthritis    Heart murmur    pt has had an echocardiogram 06/26/21   HFrEF (heart failure with reduced ejection fraction) (HCC)    History of kidney stones    White coat syndrome with diagnosis of hypertension     Past Surgical History:  Procedure Laterality Date   ANKLE FRACTURE SURGERY Right    ANTERIOR CERVICAL DECOMP/DISCECTOMY FUSION N/A 08/15/2022   Procedure: Anterior Cervical Decompression Fusion  - Cervical four-Cervical five - Cervical five-Cervical six - Cervical six-Cervical seven;  Surgeon: Arman Bogus, MD;  [facility] OR;  Service: Neurosurgery;  Laterality: N/A;   KIDNEY SURGERY Left    LUMBAR LAMINECTOMY/DECOMPRESSION MICRODISCECTOMY N/A 10/10/2022   Procedure: Laminectomy - Lumbar two-three- Lumbar three-Lumbar four;  Surgeon: Arman Bogus, MD;  Location: [facility] OR;  Service: Neurosurgery;  Laterality: N/A;   TONSILLECTOMY      Family History: No family history on file.  Social History  reports that he quit smoking about 59 years ago. His smoking use included cigarettes. He started smoking about 46 years ago. He has never used smokeless tobacco. He reports that he does not currently use alcohol. He reports that he does not use drugs.  No Known Allergies  Medications   Current Facility-Administered Medications:    [START ON 04/04/2023]  stroke: early stages of recovery book, , Does not apply, Once, Tiffany Kocher, DO   carvedilol (COREG) tablet 3.125 mg, 3.125 mg, Oral, BID WC, Gomes, Adriana, DO, 3.125 mg at  04/03/23 1819   dextrose 5 % solution, , Intravenous, Continuous, Tiffany Kocher, DO, Last Rate: 100 mL/hr at 04/03/23 1507, New Bag at 04/03/23 1507   enoxaparin (LOVENOX) injection 40 mg, 40 mg, Subcutaneous, Daily, Jena Gauss, Allee, MD, 40 mg at 04/03/23 4098   feeding supplement (ENSURE ENLIVE / ENSURE PLUS) liquid 237 mL, 237 mL, Oral, BID BM, Maxwell, Allee, MD, 237 mL at 04/03/23 1191   [name]'s butt cream, , Topical, BID, Latrelle Dodrill, MD, Given at 04/03/23 0927  Vitals   Vitals:   04/03/23 0400 04/03/23 0729 04/03/23 1109 04/03/23 1335  BP: 129/87 132/87  117/68  Pulse: 74 86  76  Resp:  16  16  Temp: 98.2 F (36.8 C) 97.6 F (36.4 C)  98.2 F (36.8 C)  TempSrc: Oral Oral  Oral  SpO2: 99% 98%  98%  Weight:   58.6 kg     Body mass index is 20.22 kg/m.  Physical Exam   Constitutional: Appears well-developed and well-nourished. *** Psych: Affect appropriate to situation. *** Eyes: No scleral injection. *** HENT: No OP obstruction. *** Head: Normocephalic. *** Cardiovascular: Normal rate and regular rhythm. *** Respiratory: Effort normal, non-labored breathing. *** GI: Soft.  No distension. There is no tenderness. *** Skin: WDI. ***  Neurologic Examination   ***  Labs/Imaging/Neurodiagnostic studies   CBC:  Recent Labs  Lab 2023-04-12 1937 Apr 12, 2023 2017 04/02/23 0405 04/03/23 0623  WBC 16.7*  --  12.8* 9.7  NEUTROABS 14.3*  --   --   --  HGB 14.3   < > 14.8 13.9  HCT 43.5   < > 44.4 42.2  MCV 91.0  --  89.9 91.3  PLT 318  --  281 265   < > = values in this interval not displayed.   Basic Metabolic Panel:  Lab Results  Component Value Date   NA 147 (H) 04/03/2023   K 4.0 04/03/2023   CO2 23 04/03/2023   GLUCOSE 140 (H) 04/03/2023   BUN 50 (H) 04/03/2023   CREATININE 1.19 04/03/2023   CALCIUM 9.5 04/03/2023   GFRNONAA >60 04/03/2023   Lipid Panel: No results found for: "LDLCALC" HgbA1c:  Lab Results  Component Value Date   HGBA1C 5.9  (H) 04/03/2023   Urine Drug Screen:     Component Value Date/Time   LABOPIA NONE DETECTED 04/01/2023 2025   COCAINSCRNUR NONE DETECTED 04/01/2023 2025   LABBENZ NONE DETECTED 04/01/2023 2025   AMPHETMU NONE DETECTED 04/01/2023 2025   THCU NONE DETECTED 04/01/2023 2025   LABBARB NONE DETECTED 04/01/2023 2025    Alcohol Level     Component Value Date/Time   ETH <10 04/01/2023 1949   INR  Lab Results  Component Value Date   INR 0.9 10/01/2022   TTE:  1. Left ventricular ejection fraction, by estimation, is 55 to 60%. The  left ventricle has normal function. The left ventricle has no regional  wall motion abnormalities. Left ventricular diastolic parameters are  consistent with Grade I diastolic  dysfunction (impaired relaxation).   2. Right ventricular systolic function is normal. The right ventricular  size is normal.   3. Left atrial size was mildly dilated.   4. The mitral valve is normal in structure. No evidence of mitral valve  regurgitation. No evidence of mitral stenosis.   5. The aortic valve is tricuspid. Aortic valve regurgitation is not  visualized. Aortic valve sclerosis/calcification is present, without any  evidence of aortic stenosis.   6. The inferior vena cava is normal in size with greater than 50%  respiratory variability, suggesting right atrial pressure of 3 mmHg.   ASSESSMENT   Marco Alvarado is a 78 y.o. male  has a past medical history of Arthritis, Heart murmur, HFrEF (heart failure with reduced ejection fraction) (HCC), History of kidney stones, and White coat syndrome with diagnosis of hypertension. 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 Chronic microhemorrhages scattered within the supratentorial and infratentorial brain, likely reflecting sequela of chronic hypertensive  microangiopathy. 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 Moderate stenosis in the proximal left M1 segment. Moderate stenosis in the superior right M2 division with more distal M3 stenoses. Moderate stenosis in the inferior left M2 segment. 50% stenosis in the mid basilar artery. No other significant proximal stenosis, aneurysm, or branch vessel occlusion within the Circle of Willis. Aortic atherosclerosis. - EKG: Sinus tachycardia; Probable left atrial enlargement; LVH with IVCD, LAD and secondary repol abnrm; Anterior ST elevation, probably due to LVH - TTE: LVEF 55 to 60%. LV with no regional wall motion abnormalities. Left atrial size was mildly dilated. No mural thrombus or valvular vegetation mentioned in the report.  -     RECOMMENDATIONS  - Cardiac telemetry. Recommend 1. HgbA1c, fasting lipid panel 2. MRI/MRA of the brain without contrast 3. PT consult, OT consult,  Speech consult 4. Echocardiogram 5. 80 mg of Atorvistatin 6. Prophylactic therapy-Antiplatelet med:  7. Risk factor modification 8. Telemetry monitoring 9. Frequent neuro checks 10 NPO until passes stroke swallow screen 11 please page stroke NP  Or  PA  Or MD from 8am -4 pm  as this patient from this time will be  followed by the stroke.   You can look them up on [email or web address]  Password [email or web address]    ______________________________________________________________________    Marco Alvarado, Marco Heffley, MD [facility]

## 2023-04-03 NOTE — Assessment & Plan Note (Addendum)
Reported encephalopathy prior to admission. Patient alert and oriented x 4 today. Ammonia, TSH normal.  HIV/RPR negative. Patient having coughing fits after drinking liquids.  -CTM -SLP consult ordered for swallowing eval - appreciate recs  -Brain MRI today  -Delirium and fall precautions

## 2023-04-03 NOTE — Assessment & Plan Note (Addendum)
Chronic over the last year.  Patient has an appointment with neurology outpatient.  Will continue to monitor symptomatically including possibly consulting neurology if patient starts to demonstrate encephalopathic signs again. -Brain MRI today

## 2023-04-03 NOTE — Progress Notes (Signed)
Daily Progress Note Intern Pager: [phone]  Patient name: Marco Alvarado Medical record number: [id number] Date of birth: 1946/02/21 Age: 78 y.o. Gender: male  Primary Care Provider: Theodis Shove, DO Consultants: ID     Code Status: Full   Pt Overview and Major Events to Date:  1/22: Admitted to FMTS   Assessment and Plan:   Marco Alvarado is a 78 y.o. male presenting with fall, found down at his home after unknown period of time. Additionally with acute encephalopathic changes that have since improved.  Fall secondary to balance issues thought to be most likely as he has a history of lumbar fusion and residual foot drop bilaterally per wife. No evidence of metabolic disturbance. Head and Cspine imaging negative.  Assessment & Plan Encephalopathy Reported encephalopathy prior to admission. Patient alert and oriented x 4 today. Ammonia, TSH normal.  HIV/RPR negative. Patient having coughing fits after drinking liquids.  -CTM -SLP consult ordered for swallowing eval - appreciate recs  -Brain MRI today  -Delirium and fall precautions Fall Ground level fall. Patient seems to have been down for an unknown amount of time, possibly several days.  Head and C-spine imaging negative.  CK trended down.  Ammonia, TSH normal.  HIV/RPR negative. Passed swallow screen.  - Delirium precautions, fall precautions - OT/PT eval and treat -- Pending SNF placement  -- AM CBC, BMP, Mg -replete electrolytes as indicated Bacteremia Blood cultures positive for E faecalis and Staph epi, from two different bottles.  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  Patient has an appointment with neurology outpatient.  Will continue to monitor symptomatically including possibly consulting neurology if patient starts to demonstrate encephalopathic signs again. -Brain MRI today  Elevated serum creatinine Elevated to 1.5 on admission,  now downtrended to 1.3. Likely prerenal given signs of dehydration on exam. 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 S/p 1 L IVF, remains dehydrated on exam. Hypernatremic this morning Na 151.  - Maintenance 122mL/hr D5water.  - Urine Osms - Strict I/Os - AM BMP Pressure ulcer Pressure ulcer noted on the back.  This does appear to be secondary to his fall and subsequent laying on the ground for several days, with irritation likely secondary to contact with urine/feces.   - Wound care - Continue to monitor for infectious symptoms. EKG abnormalities On arrival, patient had T wave inversions that were new in lateral leads. Troponins trended flat.  Case discussed by ED provider with cards on call.  Did not have concern for immediate cath need or concerns for STEMI.   - Continue to monitor symptomatically. Tachycardia (Resolved: 04/03/2023) Likely in the setting of dehydration. Now stable, receiving IVF.  - Cont home Coreg 3.125 BID   Chronic and Stable Issues: NICM: Resume Coreg, Holding Entresto  HFmrEF  LBBB: Daily weights, Strict I/Os. Bedside echo from 12/2022 with LVEF 45-50%. Sees cardiology with [facility].  Housing concern  worsening weakness: Patient lives alone and is unable to perform ADLs.  Worsening memory loss and difficulty with mobility over the last year.  SNF recommended  FEN/GI: Regular diet VTE Prophylaxis: Lovenox  Dispo: SNF pending continued clinical improvement  Subjective:  No reported changes. Mouth dry.   Objective: Temp:  [97.7 F (36.5 C)-98.2 F (36.8 C)] 98.2 F (36.8 C) (01/24 0400) Pulse Rate:  [74-115] 74 (01/24 0400) Resp:  [14-35] 18 (01/24 0011) BP: (100-155)/(64-90) 129/87 (01/24 0400) SpO2:  [97 %-100 %]  99 % (01/24 0400) Physical Exam: General: Well-appearing. Resting comfortably in room. CV: Normal S1/S2. No extra heart sounds. Warm and well-perfused. Pulm: Breathing comfortably on room air. CTAB anteriorly. No increased WOB. Abd: Soft,  non-tender, non-distended. Skin/Ext:  Warm, dry. No LE edema.  Psych: Pleasant and appropriate.    Laboratory: Most recent CBC Lab Results  Component Value Date   WBC 12.8 (H) 04/02/2023   HGB 14.8 04/02/2023   HCT 44.4 04/02/2023   MCV 89.9 04/02/2023   PLT 281 04/02/2023   Most recent BMP    Latest Ref Rng & Units 04/02/2023    4:05 AM  BMP  Glucose 70 - 99 mg/dL 409   BUN 8 - 23 mg/dL 60   Creatinine 8.11 - 1.24 mg/dL 9.14   Sodium 782 - 956 mmol/L 142   Potassium 3.5 - 5.1 mmol/L 3.8   Chloride 98 - 111 mmol/L 109   CO2 22 - 32 mmol/L 20   Calcium 8.9 - 10.3 mg/dL 8.8     [name], MD 04/03/2023, 7:15 AM  PGY-1, [facility] FPTS Intern pager: [phone], text pages welcome Secure chat group [facility] Teaching Service

## 2023-04-03 NOTE — TOC Initial Note (Signed)
Transition of Care [facility]) - Initial/Assessment Note    Patient Details  Name: Marco Alvarado MRN: [id number] Date of Birth: [birth year]  Transition of [facility]) CM/SW Contact:    Lorri Frederick, LCSW Phone Number: 04/03/2023, 4:09 PM  Clinical Narrative:     Pt oriented x2 per epic.  [name] in room with him.  Pt is able to participate in conversation regarding DC.  Pt from home alone, [name] is support but does not live with him.  They are both in agreement with plan for SNF, medicare choice document provided.  Permission given to send out referral in hub, permission given to speak with Marco Alvarado.  Referral sent out in hub for SNF.              Expected Discharge Plan: Skilled Nursing Facility Barriers to Discharge: Continued Medical Work up, SNF Pending bed offer   Patient Goals and CMS Choice Patient states their goals for this hospitalization and ongoing recovery are:: my health CMS Medicare.gov Compare Post Acute Care list provided to:: Patient Choice offered to / list presented to : Patient, Spouse      Expected Discharge Plan and Services In-house Referral: Clinical Social Work   Post Acute Care Choice: Skilled Nursing Facility Living arrangements for the past 2 months: Single Family Home                                      Prior Living Arrangements/Services Living arrangements for the past 2 months: Single Family Home Lives with:: Self Patient language and need for interpreter reviewed:: Yes Do you feel safe going back to the place where you live?: Yes      Need for Family Participation in Patient Care: Yes (Comment) Care giver support system in place?: Yes (comment) Current home services: Other (comment) (none) Criminal Activity/Legal Involvement Pertinent to Current Situation/Hospitalization: No - Comment as needed  Activities of Daily Living   ADL Screening (condition at time of admission) Independently performs ADLs?: No Does the patient have a  NEW difficulty with bathing/dressing/toileting/self-feeding that is expected to last >3 days?: Yes (Initiates electronic notice to provider for possible OT consult) (weak) Does the patient have a NEW difficulty with getting in/out of bed, walking, or climbing stairs that is expected to last >3 days?: Yes (Initiates electronic notice to provider for possible PT consult) (to weak baseline uses walker) Does the patient have a NEW difficulty with communication that is expected to last >3 days?: No Is the patient deaf or have difficulty hearing?: No Does the patient have difficulty seeing, even when wearing glasses/contacts?: No Does the patient have difficulty concentrating, remembering, or making decisions?: Yes  Permission Sought/Granted Permission sought to share information with : Family Supports Permission granted to share information with : Yes, Verbal Permission Granted  Share Information with NAME: [name]  Permission granted to share info w AGENCY: SNF        Emotional Assessment Appearance:: Appears stated age Attitude/Demeanor/Rapport: Engaged Affect (typically observed): Pleasant Orientation: : Oriented to Place, Oriented to Self      Admission diagnosis:  Fall [W19.XXXA] Fall on same level, initial encounter [W18.30XA] Patient Active Problem List   Diagnosis Date Noted   Bacteremia 04/03/2023   Fall 04/01/2023   Encephalopathy 04/01/2023   Memory loss 04/01/2023   Elevated serum creatinine 04/01/2023   Pressure ulcer 04/01/2023   EKG abnormalities 04/01/2023   Postoperative  pain after spinal surgery 10/13/2022   S/P lumbar fusion 10/10/2022   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   PCP:  Theodis Shove, DO Pharmacy:   [phone] - [place], [place] - [address] AT [facility] [address] [place] [address] Phone: [phone] Fax: [phone]  [id number] [place], [place] - [address] [place] [address] Phone: [phone] Fax: [phone]     Social Drivers of Health (SDOH) Social History: SDOH Screenings   Food Insecurity: No Food Insecurity (04/02/2023)  Housing: Low Risk  (04/02/2023)  Transportation Needs: No Transportation Needs (04/02/2023)  Utilities: Not At Risk (04/02/2023)  Financial Resource Strain: Low Risk  (10/02/2022)   Received from [facility]  Social Connections: Socially Isolated (04/02/2023)  Tobacco Use: Medium Risk (04/02/2023)   SDOH Interventions:     Readmission Risk Interventions     No data to display

## 2023-04-03 NOTE — Assessment & Plan Note (Addendum)
Likely in the setting of dehydration. Now stable, receiving IVF.  - Cont home Coreg 3.125 BID

## 2023-04-04 DIAGNOSIS — I639 Cerebral infarction, unspecified: Secondary | ICD-10-CM | POA: Diagnosis not present

## 2023-04-04 LAB — BASIC METABOLIC PANEL
Anion gap: 8 (ref 5–15)
BUN: 35 mg/dL — ABNORMAL HIGH (ref 8–23)
CO2: 24 mmol/L (ref 22–32)
Calcium: 8.8 mg/dL — ABNORMAL LOW (ref 8.9–10.3)
Chloride: 108 mmol/L (ref 98–111)
Creatinine, Ser: 1.05 mg/dL (ref 0.61–1.24)
GFR, Estimated: >60 mL/min (ref >60)
Glucose, Bld: 118 mg/dL — ABNORMAL HIGH (ref 70–99)
Potassium: 3.6 mmol/L (ref 3.5–5.1)
Sodium: 140 mmol/L (ref 135–145)

## 2023-04-04 LAB — LIPID PANEL
Cholesterol: 176 mg/dL (ref 0–200)
HDL: 47 mg/dL (ref >40)
LDL Cholesterol: 104 mg/dL — ABNORMAL HIGH (ref 0–99)
Total CHOL/HDL Ratio: 3.7 {ratio}
Triglycerides: 126 mg/dL (ref <150)
VLDL: 25 mg/dL (ref 0–40)

## 2023-04-04 LAB — CBC
HCT: 37.9 % — ABNORMAL LOW (ref 39.0–52.0)
Hemoglobin: 12.4 g/dL — ABNORMAL LOW (ref 13.0–17.0)
MCH: 30 pg (ref 26.0–34.0)
MCHC: 32.7 g/dL (ref 30.0–36.0)
MCV: 91.8 fL (ref 80.0–100.0)
Platelets: 238 10*3/uL (ref 150–400)
RBC: 4.13 MIL/uL — ABNORMAL LOW (ref 4.22–5.81)
RDW: 14.2 % (ref 11.5–15.5)
WBC: 8.9 10*3/uL (ref 4.0–10.5)
nRBC: 0 % (ref 0.0–0.2)

## 2023-04-04 LAB — CULTURE, BLOOD (ROUTINE X 2): Special Requests: ADEQUATE

## 2023-04-04 MED ORDER — CLOPIDOGREL BISULFATE 75 MG PO TABS
75.0000 mg | ORAL_TABLET | Freq: Every day | ORAL | Status: DC
  Administered 2023-04-04 – 2023-04-07 (×4): 75 mg via ORAL
  Filled 2023-04-04 (×4): qty 1

## 2023-04-04 MED ORDER — ATORVASTATIN CALCIUM 80 MG PO TABS
80.0000 mg | ORAL_TABLET | Freq: Every day | ORAL | Status: DC
  Administered 2023-04-04 – 2023-04-07 (×4): 80 mg via ORAL
  Filled 2023-04-04 (×4): qty 1

## 2023-04-04 MED ORDER — SODIUM CHLORIDE 0.45 % IV SOLN: INTRAVENOUS | Status: DC

## 2023-04-04 MED ORDER — ASPIRIN 81 MG PO TBEC
81.0000 mg | DELAYED_RELEASE_TABLET | Freq: Every day | ORAL | Status: DC
  Administered 2023-04-04 – 2023-04-07 (×4): 81 mg via ORAL
  Filled 2023-04-04 (×4): qty 1

## 2023-04-04 NOTE — Assessment & Plan Note (Addendum)
Pressure ulcer noted on the back.  This does appear to be secondary to his fall and subsequent laying on the ground for several days, with irritation likely secondary to contact with urine/feces.   - Wound care - [name]'s butt cream - Continue to monitor for infectious symptoms.

## 2023-04-04 NOTE — Evaluation (Signed)
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   Memory loss 04/01/2023   Elevated serum creatinine  Hypernatremia 04/01/2023   Pressure ulcer 04/01/2023   Postoperative pain after spinal surgery 10/13/2022   S/P lumbar fusion 10/10/2022   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   Past Medical History:  Past Medical History:  Diagnosis Date   Arthritis    Heart murmur    pt has had an echocardiogram 06/26/21   HFrEF (heart failure with reduced ejection fraction) (HCC)    History of kidney stones    White coat syndrome with diagnosis of hypertension    Past Surgical History:  Past Surgical History:  Procedure Laterality Date   ANKLE FRACTURE SURGERY Right    ANTERIOR CERVICAL DECOMP/DISCECTOMY FUSION N/A 08/15/2022   Procedure: Anterior Cervical Decompression Fusion  - Cervical four-Cervical five - Cervical five-Cervical six - Cervical six-Cervical seven;  Surgeon: Arman Bogus, MD;  Location: [facility] OR;  Service: Neurosurgery;  Laterality: N/A;   KIDNEY SURGERY Left    LUMBAR LAMINECTOMY/DECOMPRESSION MICRODISCECTOMY N/A 10/10/2022   Procedure: Laminectomy - Lumbar two-three- Lumbar three-Lumbar four;  Surgeon: Arman Bogus, MD;  Location: [facility] OR;  Service: Neurosurgery;  Laterality: N/A;   TONSILLECTOMY     HPI:  78 y.o. male presenting with a fall/acute on chronic encephalopathic changes. 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  PMH includes: ACDF, PLIF, NICM  HFmrEF  LBBB  Worsening memory. CT ANGIO HEAD NECK W WO CM on 1/24 indicate a Stable acute/subacute infarct involving the right basal ganglia  and  corona radiata. MBS on 1/24 show aspiration with thins and mild aspiration with nectar thick liquids with inconsistent sensation. Pt current diet recs regular/nectar thick liquid.   Assessment / Plan / Recommendation Clinical Impression  Pt seen for skilled ST evaluation of cognition following recent fall. Pt was partially given the SLUM for evaluation,   unable to administer wholly due to attention deficits and letharg. The pt was oriented to self independently. Pt oriented   to time and situation given time delay for processing speed and redirection to the task at hand. Pt able to recognize [facility] location,  but reported he was in [place], [place]. Pt able to follow simple one step directions and simple two step directions. Through observation with  PO trials, the pt had express difficulty sequencing chin tuck strategy given explicit instruction. The pt also was not able to recall any of the five words  given approx 5 minutes of delay. The pt also displayed moderate-severe visual attention skills with concern for visual field neglect. The pt was given mod-max verbal   and visual cues to circle all of the objects on the paper. The pt had express difficulty circling the object and instead circled right above it several times with every  shape at the top of the paper. The pt given max visual cues to circle the bottom shapes perseverated on the top shapes. Additionally, the pt's clock was disorganized and had incorrect   repetitive numbers.  The pt was unable to recall most features of a short story immediately and displayed poor numeric calculation skills. The pt's  speech was [facility], with reduced vocal intensity.   The pt required repeated redirection to the conversation and task at hand and required repeated question administration and delayed processing time for simple and complex questions. The pt reports no change from baseline and had poor insight into his deficits t/o the eval.  The pt presents with a  moderate-severe cognitive impairment with marked difficulty with recall, attention, orientation, and executive function skills. Due to lack ofThe pt would benefit from continued speech therapy services to address the concerns listed above.    SLP Assessment  SLP Recommendation/Assessment: Patient needs continued Speech Lanaguage Pathology Services SLP Visit Diagnosis: Cognitive communication deficit (R41.841);Attention and concentration deficit    Recommendations for follow up therapy are one component of a multi-disciplinary discharge planning process, led by the attending physician.  Recommendations may be updated based on patient status, additional functional criteria and insurance authorization.    Follow Up Recommendations  Recommend SNF, barriers to SNF placement - TOC to f/u with patient/family for d/c plans    Assistance Recommended at Discharge  Intermittent Supervision/Assistance  Functional Status Assessment Patient has had a recent decline in their functional status and demonstrates the ability to make significant improvements in function in a reasonable and predictable amount of time.  Frequency and Duration min 3x week  1 week      SLP Evaluation Cognition  Overall Cognitive Status: Impaired/Different from baseline (Per pt reports, he feels  fine) Arousal/Alertness: Lethargic Orientation Level: Oriented to person;Oriented to situation;Oriented to time Year: 2025 Month:  (Close to February) Day of Week: Incorrect Attention: Focused Focused Attention: Impaired Focused Attention Impairment: Verbal basic;Functional basic;Functional complex;Verbal complex Memory: Impaired Memory Impairment: Storage deficit;Retrieval deficit;Decreased short term memory;Decreased recall of new information Decreased Short Term Memory: Verbal basic;Verbal complex;Functional basic;Functional complex Awareness: Impaired Awareness Impairment: Intellectual impairment;Anticipatory impairment Problem  Solving: Impaired Problem Solving Impairment: Verbal basic;Verbal complex;Functional basic;Functional complex Executive Function: Self Monitoring;Sequencing Sequencing: Impaired Sequencing Impairment: Verbal complex Self Monitoring: Impaired Self Monitoring Impairment: Verbal complex Safety/Judgment: Impaired       Comprehension  Auditory Comprehension Overall Auditory Comprehension: Appears within functional limits for tasks assessed Yes/No Questions: Within Functional Limits Commands: Within Functional Limits Conversation: Simple Interfering Components: Attention;Processing speed;Working Radio broadcast assistant: Technical brewer Discrimination: Exceptions to [facility] Other Visual Recogniton/Discrimination Comments: Pt perseverated on identifying and circling objects on a paper given delayed processing time and verbal and visual cues Reading Comprehension Reading Status: Not tested    Expression Expression Primary Mode of Expression: Verbal Verbal Expression Overall Verbal Expression: Other (comment) Initiation: No impairment Repetition: No impairment Naming: No impairment Written Expression Dominant Hand: Right Written Expression: Not tested   Oral / Motor  Motor Speech Overall Motor Speech: Appears within functional limits for tasks assessed Respiration: Within functional limits Phonation: Low vocal intensity Resonance: Within functional limits Articulation: Within functional limitis Intelligibility: Intelligible Motor Planning: Witnin functional limits Motor Speech Errors: Not applicable Interfering Components: Hearing loss           Dione Housekeeper M.S. CCC-SLP

## 2023-04-04 NOTE — Progress Notes (Signed)
Speech Language Pathology Treatment: Dysphagia  Patient Details Name: Marco Alvarado MRN: [id number] DOB: [birth year] Today's Date: 04/04/2023 Time: 7829-5621 SLP Time Calculation (min) (ACUTE ONLY): 10 min  Assessment / Plan / Recommendation Clinical Impression  Pt seen for skilled ST services for PO trials with thin and nectar thick liquids. Pt was repositioned The pt was intermittently distracted and lethargic and required repeated redirection to stay awake and stay alert to the task at hand. The pt reported having "no issues swallowing" despite education of results following yesterdays MBS. The pt does recall the test in general. The pt had x5 PO trials of nectar thick liquid with mild wet vocal quality but no other overt s/sx of aspiration. The pt then had x2 sips of thin liquid, initial cup sip had wet vocal quality and a delayed throat clearance and mild-moderate anterior loss of the bolus. The second trial the SLP demonstrated the sequencing for a chin tuck, the pt was cognitively unable to sequence the task (attempting to swallow while slightly lowering his head and saying "ah" simultaneously). Pt still presents as an aspiration risk and would benefit from supervision with meals. The pt had immediate wet vocal quality and a delayed throat clearance. SLP to f/u with more PO trials when the pt is more awake and alert.  HPI HPI: 78 y.o. male presenting with a fall/acute on chronic encephalopathic changes. 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  PMH includes: ACDF, PLIF, NICM  HFmrEF  LBBB  Worsening memory. CT ANGIO HEAD NECK W WO CM on 1/24 indicate a Stable acute/subacute infarct involving the right basal ganglia  and corona radiata. MBS on 1/24 show aspiration with thins and mild aspiration with nectar thick liquids with inconsistent sensation. Pt current diet recs regular/nectar thick liquid.      SLP Plan  Continue with current plan of care  Patient needs  continued Speech Lanaguage Pathology Services   Recommendations for follow up therapy are one component of a multi-disciplinary discharge planning process, led by the attending physician.  Recommendations may be updated based on patient status, additional functional criteria and insurance authorization.    Recommendations  Diet recommendations: Regular;Nectar-thick liquid Liquids provided via: Cup Medication Administration: Whole meds with puree Supervision: Full supervision/cueing for compensatory strategies Compensations: Minimize environmental distractions;Slow rate;Small sips/bites Postural Changes and/or Swallow Maneuvers: Seated upright 90 degrees;Upright 30-60 min after meal                      Intermittent Supervision/Assistance Cognitive communication deficit (R41.841);Attention and concentration deficit     Continue with current plan of care     Dione Housekeeper M.S. CCC-SLP

## 2023-04-04 NOTE — Assessment & Plan Note (Addendum)
Elevated to 1.5 on admission, now resolved 1.05. Hypernatremia initial 150 now 140. Uosm 873, mildly elevated. Not having great PO intake still and still some dry mucous membranes - change D5W to 1/2 NS @ 125 - Strict I/Os - AM BMP

## 2023-04-04 NOTE — Assessment & Plan Note (Addendum)
Reported encephalopathy prior to admission. Patient alert and oriented

## 2023-04-04 NOTE — Assessment & Plan Note (Signed)
On arrival, patient had T wave inversions that were new in lateral leads. Troponins trended flat.  Case discussed by ED provider with cards on call.  Did not have concern for immediate cath need or concerns for STEMI.   - Continue to monitor symptomatically.

## 2023-04-04 NOTE — Progress Notes (Addendum)
Daily Progress Note Intern Pager: [phone]  Patient name: Marco Alvarado Medical record number: [id number] Date of birth: [birth year] Age: 78 y.o. Gender: male  Primary Care Provider: Theodis Shove, DO Consultants: ID (s/o), Neurology Code Status: FULL  Pt Overview and Major Events to Date:  1/22 admitted 1/24 MR Brain R Basal ganglia stroke  Assessment and Plan:  [name] PMH LBBB, mild nonischemic cardiomyopathy, back surgery is a 78 y.o. male presenting with fall, found down at his home after unknown period of time. MR Brain yesterday confirms stroke and neurology is following Assessment & Plan Fall  Infarction of right basal ganglia (HCC) Ground level fall per hx. Patient seems to have been down for an unknown amount of time, possibly several days. MRI Brain shows acute right corona radiata and basal ganglia infarct. CTA Head/Neck-multiple stenosis in M1/M2. Risk strat: LDL 104, A1c 5.9, TSH 3.2. -Neurology following, appreciate recs -DAPT with ASA and Plavix -Patient declines statin to me -30 day event monitor on discharge -Frequent Neuro checks -Delirium precautions, fall precautions -OT/PT-rec SNF -SLP-regular diet + nectar thick after MBS -AM CBC, BMP Positive Blood Cultures Blood cultures positive for E faecalis and Staph epi 1/4, from two different bottles. ID believes most likely contaminant.  - s/p Amoxicillin (1/23) Elevated serum creatinine  Hypernatremia Elevated to 1.5 on admission, now resolved 1.05. Hypernatremia initial 150 now 140. Uosm 873, mildly elevated. Not having great PO intake still and still some dry mucous membranes - change D5W to 1/2 NS @ 125 - Strict I/Os - AM BMP Pressure ulcer Pressure ulcer noted on the back.  This does appear to be secondary to his fall and subsequent laying on the ground for several days, with irritation likely secondary to contact with urine/feces.   - Wound care - [name]'s butt cream - Continue to  monitor for infectious symptoms. Encephalopathy (Resolved: 04/04/2023) Reported encephalopathy prior to admission. Patient alert and oriented  EKG abnormalities (Resolved: 04/04/2023) On arrival, patient had T wave inversions that were new in lateral leads. Troponins trended flat.  Case discussed by ED provider with cards on call.  Did not have concern for immediate cath need or concerns for STEMI.   - Continue to monitor symptomatically.  Chronic and Stable Issues: NICM: Resume Coreg, Holding Entresto  HFmrEF  LBBB: Daily weights, Strict I/Os. Bedside echo from 12/2022 with LVEF 45-50%. Sees cardiology with [facility].  Housing concern  worsening weakness: Patient lives alone and is unable to perform ADLs.  Worsening memory loss and difficulty with mobility over the last year.  SNF recommended  FEN/GI: Regular, nectar thick PPx: Lovenox Dispo:SNF pending clinical improvement   Subjective:  Patient states that he has been doing okay.  States that his back is irritating him some but otherwise doing okay.  He declines statin therapy with his stroke after discussion of risk factors and additional stroke prevention he continues to decline.  States that he has heard bad things about the medicine.  Objective: Temp:  [98.2 F (36.8 C)-98.6 F (37 C)] 98.4 F (36.9 C) (01/25 0401) Pulse Rate:  [76-94] 79 (01/25 0401) Resp:  [16] 16 (01/25 0401) BP: (117-140)/(68-80) 140/80 (01/25 0401) SpO2:  [95 %-98 %] 95 % (01/25 0401) Weight:  [58.6 kg-60 kg] 60 kg (01/25 0500) Physical Exam: General: NAD, awake, alert, responsive to all questions Cardiovascular: RRR, no murmurs rubs or gallops Respiratory: CTAB, no increased work of breathing on room air Abdomen: Soft, nontender, normoactive bowel sounds Extremities: No  lower extremity edema or calf tenderness; superficial back abrasions present without infective appearance Neuro: CN II: pupils equal CN III, IV,VI: EOMI CV V: Normal sensation in V1,  V2, V3 CVII: Symmetric smile and brow raise CN VIII: Normal hearing CN IX,X: Symmetric palate raise  CN XI: 5/5 shoulder shrug CN XII: Symmetric tongue protrusion (dry mucous membranes)  Laboratory: Most recent CBC Lab Results  Component Value Date   WBC 8.9 04/04/2023   HGB 12.4 (L) 04/04/2023   HCT 37.9 (L) 04/04/2023   MCV 91.8 04/04/2023   PLT 238 04/04/2023   Most recent BMP    Latest Ref Rng & Units 04/04/2023    6:24 AM  BMP  Glucose 70 - 99 mg/dL 010   BUN 8 - 23 mg/dL 35   Creatinine 2.72 - 1.24 mg/dL 5.36   Sodium 644 - 034 mmol/L 140   Potassium 3.5 - 5.1 mmol/L 3.6   Chloride 98 - 111 mmol/L 108   CO2 22 - 32 mmol/L 24   Calcium 8.9 - 10.3 mg/dL 8.8      Levin Erp, MD 04/04/2023, 7:50 AM  PGY-3, [facility] FPTS Intern pager: [phone], text pages welcome Secure chat group [facility] Teaching Service

## 2023-04-04 NOTE — Assessment & Plan Note (Addendum)
Ground level fall per hx. Patient seems to have been down for an unknown amount of time, possibly several days. MRI Brain shows acute right corona radiata and basal ganglia infarct. CTA Head/Neck-multiple stenosis in M1/M2. Risk strat: LDL 104, A1c 5.9, TSH 3.2. -Neurology following, appreciate recs -DAPT with ASA and Plavix -Patient declines statin to me -30 day event monitor on discharge -Frequent Neuro checks -Delirium precautions, fall precautions -OT/PT-rec SNF -SLP-regular diet + nectar thick after MBS -AM CBC, BMP

## 2023-04-04 NOTE — Assessment & Plan Note (Signed)
Blood cultures positive for E faecalis and Staph epi 1/4, from two different bottles. ID believes most likely contaminant.  - s/p Amoxicillin (1/23)

## 2023-04-04 NOTE — Plan of Care (Signed)
Problem: Education: Goal: Knowledge of General Education information will improve Description: Including pain rating scale, medication(s)/side effects and non-pharmacologic comfort measures Outcome: Progressing   Problem: Coping: Goal: Level of anxiety will decrease Outcome: Progressing

## 2023-04-04 NOTE — Progress Notes (Signed)
STROKE TEAM PROGRESS NOTE   BRIEF HPI Mr. Rasmus Preusser is a 78 y.o. male with history of heart failure with reduced ejection fraction (most recent EF 55-60%), arthritis, heart murmur, memory loss who presented after a fall.  Patient was found down by his wife disoriented and not making sense. MRI brain showed acute infarct in right corona radiata and right basal ganglia. 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   NIH on Admission 1   SIGNIFICANT HOSPITAL EVENTS   INTERIM HISTORY/SUBJECTIVE: Complains of chronic low back pain. Reportedly declined taking statin (will address tomorrow).    OBJECTIVE  CBC    Component Value Date/Time   WBC 8.9 04/04/2023 0624   RBC 4.13 (L) 04/04/2023 0624   HGB 12.4 (L) 04/04/2023 0624   HCT 37.9 (L) 04/04/2023 0624   PLT 238 04/04/2023 0624   MCV 91.8 04/04/2023 0624   MCH 30.0 04/04/2023 0624   MCHC 32.7 04/04/2023 0624   RDW 14.2 04/04/2023 0624   LYMPHSABS 0.8 04/01/2023 1937   MONOABS 1.5 (H) 04/01/2023 1937   EOSABS 0.0 04/01/2023 1937   BASOSABS 0.0 04/01/2023 1937    BMET    Component Value Date/Time   NA 140 04/04/2023 0624   K 3.6 04/04/2023 0624   CL 108 04/04/2023 0624   CO2 24 04/04/2023 0624   GLUCOSE 118 (H) 04/04/2023 0624   BUN 35 (H) 04/04/2023 0624   CREATININE 1.05 04/04/2023 0624   CALCIUM 8.8 (L) 04/04/2023 0624   GFRNONAA >60 04/04/2023 0624    IMAGING past 24 hours CT ANGIO HEAD NECK W WO CM Result Date: 04/03/2023 CLINICAL DATA:  Neuro deficit, acute, stroke suspected. Cognitive change. Slurred speech. Acute infarct involving the right basal ganglia and corona radiata. EXAM: CT ANGIOGRAPHY HEAD AND NECK WITH AND WITHOUT CONTRAST TECHNIQUE: Multidetector CT imaging of the head and neck was performed using the standard protocol during bolus administration of intravenous contrast. Multiplanar CT image reconstructions and MIPs were obtained to evaluate the vascular anatomy.  Carotid stenosis measurements (when applicable) are obtained utilizing NASCET criteria, using the distal internal carotid diameter as the denominator. RADIATION DOSE REDUCTION: This exam was performed according to the departmental dose-optimization program which includes automated exposure control, adjustment of the mA and/or kV according to patient size and/or use of iterative reconstruction technique. CONTRAST:  75mL OMNIPAQUE IOHEXOL 350 MG/ML SOLN COMPARISON:  MR head without and with contrast 04/03/2023. FINDINGS: CT HEAD FINDINGS Brain: The acute/subacute infarct involving the right basal ganglia and corona radiata is stable. More remote lacunar infarcts in the inferior basal ganglia are stable. A remote infarct involving the left lentiform nucleus and external capsule is stable. Advanced atrophy and diffuse white matter disease is otherwise unchanged. The ventricles are proportionate to the degree of atrophy. No significant extraaxial fluid collection is present. The brainstem and cerebellum are within normal limits. Midline structures are within normal limits. Vascular: Atherosclerotic calcifications are present within the cavernous internal carotid arteries bilaterally. No hyperdense vessel is present. Skull: Calvarium is intact. No focal lytic or blastic lesions are present. No significant extracranial soft tissue lesion is present. Sinuses/Orbits: The paranasal sinuses and mastoid air cells are clear. The globes and orbits are within normal limits. Review of the MIP images confirms the above findings CTA NECK FINDINGS Aortic arch: Atherosclerotic calcifications are present at the origin of the left subclavian artery without significant stenosis and in the distal aorta. No aneurysm or dissection is present.  Right carotid system: The right common carotid artery is within normal limits. Atherosclerotic changes are present at the carotid bifurcation. A high-grade stenosis is present in the proximal right  external carotid artery proximally. The internal carotid artery is unremarkable. Left carotid system: The left common carotid artery is within normal limits. Atherosclerotic changes are present bifurcation without significant stenosis. The cervical left ICA is normal. Vertebral arteries: The vertebral arteries are codominant. Both vertebral arteries originate from the subclavian arteries without significant stenosis. No significant stenosis is present in either vertebral artery in the neck. Skeleton: Slight degenerative anterolisthesis is present at C3-4. Solid anterior fusion is present at C4-5, C5-6 and C6-7. No focal osseous lesions are present. Other neck: Soft tissues the neck are otherwise unremarkable. Salivary glands are within normal limits. Thyroid is normal. No significant adenopathy is present. No focal mucosal or submucosal lesions are present. Upper chest: The lung apices are clear. The thoracic inlet is within normal limits. Review of the MIP images confirms the above findings CTA HEAD FINDINGS Anterior circulation: Focal calcification and high-grade stenosis is present in the supraclinoid right ICA. Calcifications are present in the cavernous left ICA without focal stenosis. The right A1 segment is aplastic. The right M1 segment is normal. The left A1 segment is normal. Moderate narrowing is present in the proximal left M1 segment. The MCA bifurcations normal bilaterally. Moderate stenosis is present in the superior right M2 division with more distal M3 stenoses. Moderate stenosis is present in the inferior left M2 segment. Mild segmental irregularity is present throughout the ACA branch vessels without focal stenosis. No aneurysm is present. Posterior circulation: The PICA origins are visualized and normal. The vertebrobasilar junction is normal. A 50% stenosis is present in the mid basilar artery. The superior cerebellar arteries are patent. Both posterior cerebral arteries originate from basilar  tip. The PCA branch vessels are normal bilaterally. Venous sinuses: The dural sinuses are patent. The straight sinus and deep cerebral veins are intact. Cortical veins are within normal limits. No significant vascular malformation is evident. Anatomic variants: None Review of the MIP images confirms the above findings IMPRESSION: 1. Stable acute/subacute infarct involving the right basal ganglia and corona radiata. 2. Stable remote lacunar infarcts in the inferior basal ganglia. 3. Stable remote infarct involving the left lentiform nucleus and external capsule. 4. Advanced atrophy and diffuse white matter disease likely reflects the sequela of chronic microvascular ischemia. 5. Focal calcification and high-grade stenosis in the supraclinoid right ICA. 6. Moderate stenosis in the proximal left M1 segment. 7. Moderate stenosis in the superior right M2 division with more distal M3 stenoses. 8. Moderate stenosis in the inferior left M2 segment. 9. 50% stenosis in the mid basilar artery. 10. No other significant proximal stenosis, aneurysm, or branch vessel occlusion within the Circle of Willis. 11.  Aortic Atherosclerosis (ICD10-I70.0). Electronically Signed   By: Marin Roberts M.D.   On: 04/03/2023 18:03   DG Swallowing Func-Speech Pathology Result Date: 04/03/2023 Table formatting from the original result was not included. Modified Barium Swallow Study Patient Details Name: Waleed Dettman MRN: [id number] Date of Birth: 10-21-1945 Today's Date: 04/03/2023 HPI/PMH: HPI: 78 y.o. male presenting with a fall/acute on chronic encephalopathic changes. 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  PMH includes: ACDF, PLIF, NICM  HFmrEF  LBBB  Worsening memory.  CT Head:No intracranial hemorrhage, mass effect, or evidence of acute  infarct. Clinical Impression: Clinical Impression: Pt has an oropharyngeal dysphagia with impaired timing that  contributes to aspiration. Oral phase is marked by  disorganized lingual transit and passive spillage of boluses into hte pharynx, occuring in more volume when consuming consecutive boluses of liquids. He has reduced laryngeal vestibule closure and laryngeal elevation, contributing to penetration and aspiration when he becomes more dyscoordinated. Aspiration occurs with thin liquids but x1 (trace) with initial spoonful of nectar thick liquids, and is inconsistently sensed. A chin tuck helped with isolated trials of thin liquids to better contain the bolus away from the airway, but there is still aspiration on subsequent consecutive boluses. Recommend considering regular solids but with nectar thick liquids and ongoing SLP f/u for dysphagia intervention. Also note results from MRI today and recommend ordering SLP cognitive-linguistic evaluation as well. Factors that may increase risk of adverse event in presence of aspiration Rubye Oaks & Clearance Coots 2021): Factors that may increase risk of adverse event in presence of aspiration Rubye Oaks & Clearance Coots 2021): Reduced cognitive function; Frail or deconditioned; Weak cough Recommendations/Plan: Swallowing Evaluation Recommendations Swallowing Evaluation Recommendations Recommendations: PO diet PO Diet Recommendation: Regular; Mildly thick liquids (Level 2, nectar thick) Liquid Administration via: Cup; Straw Medication Administration: Whole meds with puree Supervision: Staff to assist with self-feeding; Full assist for feeding Swallowing strategies  : Minimize environmental distractions; Slow rate; Small bites/sips (one sip at a time) Postural changes: Position pt fully upright for meals Oral care recommendations: Oral care BID (2x/day) Caregiver Recommendations: Avoid jello, ice cream, thin soups, popsicles; Remove water pitcher Treatment Plan Treatment Plan Treatment recommendations: Therapy as outlined in treatment plan below Follow-up recommendations: Skilled nursing-short term rehab (<3 hours/day) Functional status assessment:  Patient has had a recent decline in their functional status and demonstrates the ability to make significant improvements in function in a reasonable and predictable amount of time. Treatment frequency: Min 2x/week Treatment duration: 2 weeks Interventions: Aspiration precaution training; Compensatory techniques; Patient/family education; Trials of upgraded texture/liquids; Diet toleration management by SLP; Respiratory muscle strength training Recommendations Recommendations for follow up therapy are one component of a multi-disciplinary discharge planning process, led by the attending physician.  Recommendations may be updated based on patient status, additional functional criteria and insurance authorization. Assessment: Orofacial Exam: Orofacial Exam Oral Cavity: Oral Hygiene: Xerostomia Oral Cavity - Dentition: Adequate natural dentition Orofacial Anatomy: WFL Oral Motor/Sensory Function: WFL Anatomy: Anatomy: Presence of cervical hardware Boluses Administered: Boluses Administered Boluses Administered: Thin liquids (Level 0); Mildly thick liquids (Level 2, nectar thick); Moderately thick liquids (Level 3, honey thick); Puree; Solid  Oral Impairment Domain: Oral Impairment Domain Lip Closure: No labial escape Tongue control during bolus hold: Posterior escape of greater than half of bolus Bolus preparation/mastication: Slow prolonged chewing/mashing with complete recollection Bolus transport/lingual motion: Repetitive/disorganized tongue motion Oral residue: Residue collection on oral structures Location of oral residue : Tongue Initiation of pharyngeal swallow : Pyriform sinuses  Pharyngeal Impairment Domain: Pharyngeal Impairment Domain Soft palate elevation: No bolus between soft palate (SP)/pharyngeal wall (PW) Laryngeal elevation: Partial superior movement of thyroid cartilage/partial approximation of arytenoids to epiglottic petiole Anterior hyoid excursion: Complete anterior movement Epiglottic movement:  Complete inversion Laryngeal vestibule closure: Incomplete, narrow column air/contrast in laryngeal vestibule Pharyngeal stripping wave : Present - complete Pharyngeal contraction (A/P view only): N/A Pharyngoesophageal segment opening: Partial distention/partial duration, partial obstruction of flow Tongue base retraction: Trace column of contrast or air between tongue base and PPW Pharyngeal residue: Trace residue within or on pharyngeal structures Location of pharyngeal residue: Pharyngeal wall; Valleculae  Esophageal Impairment Domain: No data recorded Pill: No data recorded  Penetration/Aspiration Scale Score: Penetration/Aspiration Scale Score 1.  Material does not enter airway: Puree; Solid 3.  Material enters airway, remains ABOVE vocal cords and not ejected out: Moderately thick liquids (Level 3, honey thick) 8.  Material enters airway, passes BELOW cords without attempt by patient to eject out (silent aspiration) : Thin liquids (Level 0); Mildly thick liquids (Level 2, nectar thick) Compensatory Strategies: Compensatory Strategies Compensatory strategies: Yes Straw: Effective; Ineffective Effective Straw: Mildly thick liquid (Level 2, nectar thick); Moderately thick liquid (Level 3, honey thick) Ineffective Straw: Thin liquid (Level 0) Chin tuck: Ineffective Ineffective Chin Tuck: Thin liquid (Level 0)   General Information: Caregiver present: No  Diet Prior to this Study: NPO (pt made NPO by MD after BSE)   Temperature : Normal   Respiratory Status: WFL   Supplemental O2: None (Room air)   History of Recent Intubation: No  Behavior/Cognition: Alert; Cooperative; Pleasant mood; Requires cueing Self-Feeding Abilities: Able to self-feed Baseline vocal quality/speech: Dysphonic Volitional Cough: Able to elicit Volitional Swallow: Able to elicit (intermittently) Exam Limitations: No limitations Goal Planning: Prognosis for improved oropharyngeal function: Good Barriers to Reach Goals: Cognitive deficits No data  recorded Patient/Family Stated Goal: none stated Consulted and agree with results and recommendations: Patient; Physician; Nurse Pain: Pain Assessment Pain Assessment: No/denies pain Faces Pain Scale: 4 Pain Location: L shoulder and back Pain Descriptors / Indicators: Sore Pain Intervention(s): Monitored during session End of Session: Start Time:SLP Start Time (ACUTE ONLY): 1353 Stop Time: SLP Stop Time (ACUTE ONLY): 1411 Time Calculation:SLP Time Calculation (min) (ACUTE ONLY): 18 min Charges: SLP Evaluations $ SLP Speech Visit: 1 Visit SLP Evaluations $BSS Swallow: 1 Procedure $MBS Swallow: 1 Procedure SLP visit diagnosis: SLP Visit Diagnosis: Dysphagia, oropharyngeal phase (R13.12) Past Medical History: Past Medical History: Diagnosis Date  Arthritis   Heart murmur   pt has had an echocardiogram 06/26/21  HFrEF (heart failure with reduced ejection fraction) (HCC)   History of kidney stones   White coat syndrome with diagnosis of hypertension  Past Surgical History: Past Surgical History: Procedure Laterality Date  ANKLE FRACTURE SURGERY Right   ANTERIOR CERVICAL DECOMP/DISCECTOMY FUSION N/A 08/15/2022  Procedure: Anterior Cervical Decompression Fusion  - Cervical four-Cervical five - Cervical five-Cervical six - Cervical six-Cervical seven;  Surgeon: Arman Bogus, MD;  Location: [facility] OR;  Service: Neurosurgery;  Laterality: N/A;  KIDNEY SURGERY Left   LUMBAR LAMINECTOMY/DECOMPRESSION MICRODISCECTOMY N/A 10/10/2022  Procedure: Laminectomy - Lumbar two-three- Lumbar three-Lumbar four;  Surgeon: Arman Bogus, MD;  Location: [facility] OR;  Service: Neurosurgery;  Laterality: N/A;  TONSILLECTOMY   Mahala Menghini., M.A. CCC-SLP Acute Rehabilitation Services Office [phone] Secure chat preferred 04/03/2023, 2:37 PM   Vitals:   04/03/23 2030 04/04/23 0401 04/04/23 0500 04/04/23 0749  BP: 121/78 (!) 140/80  137/89  Pulse: 94 79  83  Resp: 16 16  17   Temp: 98.6 F (37 C) 98.4 F (36.9 C)  97.7 F (36.5 C)  TempSrc:     Oral  SpO2: 98% 95%  97%  Weight:   60 kg      PHYSICAL EXAM General:  Alert, well-nourished, well-developed patient in no acute distress Psych:  Mood and affect appropriate for situation CV: Regular rate and rhythm on monitor Respiratory:  Regular, unlabored respirations on room air GI: Abdomen soft and nontender   NEURO:  Mental Status: Patient is awake, alert, oriented to person, place, year, situation.  States month is February, remembers rolling out of bed and landing on the floor.  Patient is able to give a clear and coherent history. No signs of aphasia or neglect Cranial Nerves: II: Visual Fields are full. Pupils are equal, round, and reactive to light.   III,IV, VI: EOMI without ptosis or diplopia.  V: Facial sensation is symmetric to temperature VII: Facial movement is symmetric resting and smiling VIII: Hearing is intact to voice X: Palate elevates symmetrically XI: Shoulder shrug is symmetric. XII: Tongue protrudes midline without atrophy or fasciculations.  Motor: Tone is normal. Bulk is poor Left leg drift.  Left hand is contracture, patient states this is baseline Bilateral lower extremity weakness, increased effort needed to lift leg.  Generalized weakness Sensory: Sensation is symmetric to light touch and temperature in the arms and legs. No extinction to DSS present.  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  CT head No acute abnormality.  CTA head & neck  Stable acute/subacute infarct involving the right basal ganglia and corona radiata.Focal calcification and high-grade stenosis in the supraclinoid right ICA. Moderate stenosis in the proximal left M1 segment, superior right M2 division with more distal  M3 stenoses. 50% stenosis in the mid basilar artery.  MRI  3.3 x 3.0 cm acute infarct within the right corona radiata and right basal ganglia. atrophy, advanced chronic small vessel  2D Echo EF 55 to 60%.  LV with grade 1 diastolic dysfunction.  Left atrium mildly dilated LDL 104 HgbA1c 5.9 VTE prophylaxis -Lovenox No antithrombotic prior to admission, now on aspirin 81 mg daily and clopidogrel 75 mg daily for 3 months and then aspirin alone. Therapy recommendations:  Pending Disposition: Pending  Hypertension CHF Home meds: Carvedilol 3.125, Entresto Stable Blood Pressure Goal: SBP less than 160   Hyperlipidemia Home meds: None LDL 104, goal < 70 Add atorvastatin 80 mg (patient declining, will address tomorrow morning)  Continue statin at discharge  Dysphagia Patient has post-stroke dysphagia, SLP consulted    Diet   Diet regular Room service appropriate? Yes with Assist; Fluid consistency: Nectar Thick   Advance diet as tolerated  Other Stroke Risk Factors Congestive heart failure   Other Active Problems   Hospital day # 3  I have reviewed relevant images independently for this patient.   Anibal Henderson, MD [facility]   To contact Stroke Continuity provider, please refer to [email or web address]. After hours, contact General Neurology

## 2023-04-05 DIAGNOSIS — I639 Cerebral infarction, unspecified: Secondary | ICD-10-CM | POA: Diagnosis not present

## 2023-04-05 LAB — CBC
HCT: 36.2 % — ABNORMAL LOW (ref 39.0–52.0)
Hemoglobin: 12.1 g/dL — ABNORMAL LOW (ref 13.0–17.0)
MCH: 30 pg (ref 26.0–34.0)
MCHC: 33.4 g/dL (ref 30.0–36.0)
MCV: 89.8 fL (ref 80.0–100.0)
Platelets: 220 10*3/uL (ref 150–400)
RBC: 4.03 MIL/uL — ABNORMAL LOW (ref 4.22–5.81)
RDW: 13.6 % (ref 11.5–15.5)
WBC: 9.1 10*3/uL (ref 4.0–10.5)
nRBC: 0 % (ref 0.0–0.2)

## 2023-04-05 LAB — BASIC METABOLIC PANEL
Anion gap: 8 (ref 5–15)
BUN: 27 mg/dL — ABNORMAL HIGH (ref 8–23)
CO2: 21 mmol/L — ABNORMAL LOW (ref 22–32)
Calcium: 8.5 mg/dL — ABNORMAL LOW (ref 8.9–10.3)
Chloride: 106 mmol/L (ref 98–111)
Creatinine, Ser: 1.1 mg/dL (ref 0.61–1.24)
GFR, Estimated: >60 mL/min (ref >60)
Glucose, Bld: 98 mg/dL (ref 70–99)
Potassium: 3.6 mmol/L (ref 3.5–5.1)
Sodium: 135 mmol/L (ref 135–145)

## 2023-04-05 MED ORDER — SENNA 8.6 MG PO TABS
1.0000 | ORAL_TABLET | Freq: Every day | ORAL | Status: DC
  Administered 2023-04-07: 8.6 mg via ORAL
  Filled 2023-04-05 (×2): qty 1

## 2023-04-05 MED ORDER — POLYETHYLENE GLYCOL 3350 17 G PO PACK: 17.0000 g | PACK | Freq: Every day | ORAL | Status: DC | PRN

## 2023-04-05 NOTE — Assessment & Plan Note (Addendum)
Pressure ulcer noted on the back.  This does appear to be secondary to his fall and subsequent laying on the ground for several days, with irritation likely secondary to contact with urine/feces.   - WOC has seen and made recommendations  - [name]'s butt cream - Continue to monitor for infectious symptoms.

## 2023-04-05 NOTE — Assessment & Plan Note (Addendum)
Elevated to 1.5 on admission, now resolved 1.10. Hypernatremia has responded nicely to fluids. Na is now 135. He tells me he is getting used to the thickened liquids.  - Will d/c IVF, push PO intake  - Strict I/Os - AM BMP

## 2023-04-05 NOTE — Assessment & Plan Note (Addendum)
Patient down for an unknown amount of time, possibly several days. MRI Brain shows acute right corona radiata and basal ganglia infarct. CTA Head/Neck-multiple stenosis in M1/M2. Feels well.  -Neurology following, appreciate recs.   -DAPT with ASA and Plavix -30 day event monitor on discharge -Frequent Neuro checks -Delirium precautions, fall precautions -OT/PT have seen and recommend SNF, TOC assisting with placement  -SLP consulted for dysphagia, recommending regular diet + nectar thick after MBS

## 2023-04-05 NOTE — Progress Notes (Signed)
Daily Progress Note Intern Pager: [phone]  Patient name: Marco Alvarado Medical record number: [id number] Date of birth: 07-Jan-1946 Age: 78 y.o. Gender: male  Primary Care Provider: Theodis Shove, DO Consultants: ID (s/o), Neurology Code Status: Full Code   Pt Overview and Major Events to Date:  1/22 admitted 1/24 MRI with R basal ganglia stroke   Assessment and Plan: Marco Alvarado is a 78yo male who presented with a fall after being found down at home for an unknown period of time. Workup has revealed a R basal ganglia stroke. Pertinent PMH/PSH includes LBBB, nonischemic cardiomyopathy, prior back surgery. He will need to discharge to SNF for rehabilitation and is awaiting placement. Anticipate we will be able to medically clear him later today vs tomorrow if he is able to maintain adequate oral fluid intake and if neurology gives their blessing. .  Assessment & Plan Fall  Infarction of right basal ganglia (HCC) Patient down for an unknown amount of time, possibly several days. MRI Brain shows acute right corona radiata and basal ganglia infarct. CTA Head/Neck-multiple stenosis in M1/M2. Feels well.  -Neurology following, appreciate recs.   -DAPT with ASA and Plavix -30 day event monitor on discharge -Frequent Neuro checks -Delirium precautions, fall precautions -OT/PT have seen and recommend SNF, TOC assisting with placement  -SLP consulted for dysphagia, recommending regular diet + nectar thick after MBS Elevated serum creatinine  Hypernatremia Elevated to 1.5 on admission, now resolved 1.10. Hypernatremia has responded nicely to fluids. Na is now 135. He tells me he is getting used to the thickened liquids.  - Will d/c IVF, push PO intake  - Strict I/Os - AM BMP Pressure ulcer Pressure ulcer noted on the back.  This does appear to be secondary to his fall and subsequent laying on the ground for several days, with irritation likely secondary to contact with  urine/feces.   - WOC has seen and made recommendations  - [name]'s butt cream - Continue to monitor for infectious symptoms.  FEN/GI: Regular with nectar thick fluids  PPx: Lovenox Dispo:SNF tomorrow. Barriers include maintaining adequate PO fluid intake off of IV maintenance, neurology sign off, and TOC placement.   Subjective:  [name] tells me he feels well this morning. No new issues. Eager to get out of here.   Objective: Temp:  [97.7 F (36.5 C)-98.6 F (37 C)] 98 F (36.7 C) (01/25 2122) Pulse Rate:  [71-83] 71 (01/25 2122) Resp:  [16-18] 18 (01/25 2122) BP: (128-140)/(77-89) 140/87 (01/25 2122) SpO2:  [95 %-100 %] 98 % (01/25 2122) Weight:  [60 kg] 60 kg (01/25 0500) Physical Exam: General: Awake and answers all questions appropriately Cardiovascular: RRR without murmur  Respiratory: Normal WOB on RA, lungs clear anteriorly  Abdomen: Non-tender, non-distednded  Extremities: L hand contracture at baseline, No LE edema    Laboratory: Most recent CBC Lab Results  Component Value Date   WBC 8.9 04/04/2023   HGB 12.4 (L) 04/04/2023   HCT 37.9 (L) 04/04/2023   MCV 91.8 04/04/2023   PLT 238 04/04/2023   Most recent BMP    Latest Ref Rng & Units 04/04/2023    6:24 AM  BMP  Glucose 70 - 99 mg/dL 696   BUN 8 - 23 mg/dL 35   Creatinine 2.95 - 1.24 mg/dL 2.84   Sodium 132 - 440 mmol/L 140   Potassium 3.5 - 5.1 mmol/L 3.6   Chloride 98 - 111 mmol/L 108   CO2 22 - 32 mmol/L  24   Calcium 8.9 - 10.3 mg/dL 8.8      Imaging/Diagnostic Tests: No new imaging, tests.   Alicia Amel, MD 04/05/2023, 3:24 AM  PGY-3, [facility] FPTS Intern pager: [phone], text pages welcome Secure chat group [facility] Teaching Service

## 2023-04-05 NOTE — Plan of Care (Signed)
Called wife, who confirmed patient's name and birthday. All of patient's spouse's questions were addressed and updates were provided. Patient's spouse expressed understanding and was appreciative of the call. Plan to provide her with daily updates.

## 2023-04-05 NOTE — Progress Notes (Signed)
STROKE TEAM PROGRESS NOTE   BRIEF HPI Mr. Marco Alvarado is a 78 y.o. male with history of heart failure with reduced ejection fraction (most recent EF 55-60%), arthritis, heart murmur, memory loss who presented after a fall.  Patient was found down by his wife disoriented and not making sense. MRI brain showed acute infarct in right corona radiata and right basal ganglia. 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   NIH on Admission 1   SIGNIFICANT HOSPITAL EVENTS   INTERIM HISTORY/SUBJECTIVE: Complains of chronic low back pain. Declines to take statins because he does not believe in cholesterol meds do any good; attempted to explain benefits, patient declines.    OBJECTIVE  CBC    Component Value Date/Time   WBC 9.1 04/05/2023 0455   RBC 4.03 (L) 04/05/2023 0455   HGB 12.1 (L) 04/05/2023 0455   HCT 36.2 (L) 04/05/2023 0455   PLT 220 04/05/2023 0455   MCV 89.8 04/05/2023 0455   MCH 30.0 04/05/2023 0455   MCHC 33.4 04/05/2023 0455   RDW 13.6 04/05/2023 0455   LYMPHSABS 0.8 04/01/2023 1937   MONOABS 1.5 (H) 04/01/2023 1937   EOSABS 0.0 04/01/2023 1937   BASOSABS 0.0 04/01/2023 1937    BMET    Component Value Date/Time   NA 135 04/05/2023 0455   K 3.6 04/05/2023 0455   CL 106 04/05/2023 0455   CO2 21 (L) 04/05/2023 0455   GLUCOSE 98 04/05/2023 0455   BUN 27 (H) 04/05/2023 0455   CREATININE 1.10 04/05/2023 0455   CALCIUM 8.5 (L) 04/05/2023 0455   GFRNONAA >60 04/05/2023 0455    IMAGING past 24 hours No results found.   Vitals:   04/04/23 2122 04/05/23 0508 04/05/23 0811 04/05/23 1510  BP: (!) 140/87 124/67 113/74 124/66  Pulse: 71 74 84 93  Resp: 18 18 16 16   Temp: 98 F (36.7 C) 98.2 F (36.8 C) (!) 97.5 F (36.4 C) 99 F (37.2 C)  TempSrc: Oral Oral Oral Oral  SpO2: 98% 98% 96% 93%  Weight:         PHYSICAL EXAM General:  Alert, well-nourished, well-developed patient in no acute distress Psych:  Mood and affect  appropriate for situation CV: Regular rate and rhythm on monitor Respiratory:  Regular, unlabored respirations on room air GI: Abdomen soft and nontender   NEURO:  Mental Status: Patient is awake, alert, oriented to person, place, year, situation.  States month is February, remembers rolling out of bed and landing on the floor.  Patient is able to give a clear and coherent history. No signs of aphasia or neglect Cranial Nerves: II: Visual Fields are full. Pupils are equal, round, and reactive to light.   III,IV, VI: EOMI without ptosis or diplopia.  V: Facial sensation is symmetric to temperature VII: Facial movement is symmetric resting and smiling VIII: Hearing is intact to voice X: Palate elevates symmetrically XI: Shoulder shrug is symmetric. XII: Tongue protrudes midline without atrophy or fasciculations.  Motor: Tone is normal. Bulk is poor Left leg drift.  Left hand is contracture, patient states this is baseline Bilateral lower extremity weakness, increased effort needed to lift leg.  Generalized weakness Sensory: Sensation is symmetric to light touch and temperature in the arms and legs. No extinction to DSS present.  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  CT head No acute abnormality.  CTA head & neck  Stable acute/subacute infarct involving the right basal ganglia and corona radiata.Focal calcification and high-grade stenosis in the supraclinoid right ICA. Moderate stenosis in the proximal left M1 segment, superior right M2 division with more distal M3 stenoses. 50% stenosis in the mid basilar artery.  MRI  3.3 x 3.0 cm acute infarct within the right corona radiata and right basal ganglia. atrophy, advanced chronic  small vessel  2D Echo EF 55 to 60%.  LV with grade 1 diastolic dysfunction.  Left atrium mildly dilated LDL 104 HgbA1c 5.9 VTE prophylaxis -Lovenox No antithrombotic prior to admission, now on aspirin 81 mg daily and clopidogrel 75 mg daily for 3 months and then aspirin alone. Therapy recommendations:  Pending Disposition: Pending  Hypertension CHF Home meds: Carvedilol 3.125, Entresto Stable Blood Pressure Goal: SBP less than 160   Hyperlipidemia Home meds: None LDL 104, goal < 70 Patient refuses to be on any cholesterol medications.  Continue statin at discharge  Dysphagia Patient has post-stroke dysphagia, SLP consulted    Diet   Diet regular Room service appropriate? Yes with Assist; Fluid consistency: Nectar Thick   Advance diet as tolerated  Other Stroke Risk Factors Congestive heart failure   Other Active Problems   Hospital day # 4  I have reviewed relevant images independently for this patient.   Neurology will sign off at this time.   Anibal Henderson, MD [facility]   To contact Stroke Continuity provider, please refer to [email or web address]. After hours, contact General Neurology

## 2023-04-06 DIAGNOSIS — I639 Cerebral infarction, unspecified: Secondary | ICD-10-CM | POA: Diagnosis not present

## 2023-04-06 LAB — BASIC METABOLIC PANEL
Anion gap: 9 (ref 5–15)
BUN: 32 mg/dL — ABNORMAL HIGH (ref 8–23)
CO2: 20 mmol/L — ABNORMAL LOW (ref 22–32)
Calcium: 8.5 mg/dL — ABNORMAL LOW (ref 8.9–10.3)
Chloride: 102 mmol/L (ref 98–111)
Creatinine, Ser: 0.97 mg/dL (ref 0.61–1.24)
GFR, Estimated: >60 mL/min (ref >60)
Glucose, Bld: 112 mg/dL — ABNORMAL HIGH (ref 70–99)
Potassium: 3.9 mmol/L (ref 3.5–5.1)
Sodium: 131 mmol/L — ABNORMAL LOW (ref 135–145)

## 2023-04-06 LAB — CULTURE, BLOOD (ROUTINE X 2)
Culture: NO GROWTH
Special Requests: ADEQUATE

## 2023-04-06 MED ORDER — ASPIRIN 81 MG PO TBEC: 81.0000 mg | DELAYED_RELEASE_TABLET | Freq: Every day | ORAL | Status: DC

## 2023-04-06 MED ORDER — GERHARDT'S BUTT CREAM: 1.0000 | TOPICAL_CREAM | Freq: Two times a day (BID) | CUTANEOUS | Status: DC

## 2023-04-06 MED ORDER — CLOPIDOGREL BISULFATE 75 MG PO TABS: 75.0000 mg | ORAL_TABLET | Freq: Every day | ORAL | Status: DC

## 2023-04-06 NOTE — Assessment & Plan Note (Addendum)
Pressure ulcer noted on the back on admission.  This does appear to be secondary to his fall and subsequent laying on the ground for several days, with irritation likely secondary to contact with urine/feces.   - WOC has seen and made recommendations  - [name]'s butt cream - Continue to monitor for infectious symptoms.

## 2023-04-06 NOTE — Plan of Care (Signed)
Called and spoke with patient's wife, who confirmed patient's identity and birthdate. Provided updates and answered questions. Patient's wife has further questions for LSW regarding SNF facilities. Will message LSW about this.

## 2023-04-06 NOTE — TOC Progression Note (Addendum)
Transition of Care [facility]) - Progression Note    Patient Details  Name: Marco Alvarado MRN: [id number] Date of Birth: 1945/06/11  [facility] of [facility]) CM/SW Contact  Lorri Frederick, LCSW Phone Number: 04/06/2023, 2:59 PM  Clinical Narrative:   Bed offers provided to pt wife.  She is asking for response from [facility], [facility], [facility].   1400: pt does receive offers from all three, [facility] reports they do have flu in the building.  Wife updated, she will choose place by the end of the day.    1600: CSW spoke with wife and pt, they will accept offer at [facility].    Expected Discharge Plan: Skilled Nursing Facility Barriers to Discharge: Continued Medical Work up, SNF Pending bed offer  Expected Discharge Plan and Services In-house Referral: Clinical Social Work   Post Acute Care Choice: Skilled Nursing Facility Living arrangements for the past 2 months: Single Family Home                                       Social Determinants of Health (SDOH) Interventions SDOH Screenings   Food Insecurity: No Food Insecurity (04/02/2023)  Housing: Low Risk  (04/02/2023)  Transportation Needs: No Transportation Needs (04/02/2023)  Utilities: Not At Risk (04/02/2023)  Financial Resource Strain: Low Risk  (10/02/2022)   Received from [facility]  Social Connections: Socially Isolated (04/02/2023)  Tobacco Use: Medium Risk (04/02/2023)    Readmission Risk Interventions     No data to display

## 2023-04-06 NOTE — Progress Notes (Addendum)
Physical Therapy Treatment Patient Details Name: Marco Alvarado MRN: [id number] DOB: 07-Mar-1946 Today's Date: 04/06/2023   History of Present Illness 78 y.o. male presents 04/01/23 after a fall at home with multiple days on the floor, encephalopathic changes, pressure ulcer on back. MRI with 3.3 x 3.0 cm acute infarct within right corona radiata and right basal ganglia. 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 Performed bed level exercises for BLE strengthening before transitioning to edge of bed with moderate assist. Worked on midline posture and anterior weight shift due to right lateral/posterior lean. Pt requiring moderate assist for transfers to standing and pivoting to chair. Displays left sided weakness, impaired sitting/standing balance, cognition. Patient will benefit from continued inpatient follow up therapy, <3 hours/day to address deficits and maximize functional mobility.    If plan is discharge home, recommend the following: A lot of help with walking and/or transfers;A lot of help with bathing/dressing/bathroom;Assistance with cooking/housework;Assist for transportation;Help with stairs or ramp for entrance;Supervision due to cognitive status   Can travel by private vehicle     No  Equipment Recommendations  None recommended by PT    Recommendations for Other Services       Precautions / Restrictions Precautions Precautions: Fall Restrictions Weight Bearing Restrictions Per Provider Order: No     Mobility  Bed Mobility Overal bed mobility: Needs Assistance Bed Mobility: Supine to Sit     Supine to sit: Mod assist     General bed mobility comments: Pt able to initiate transitioning to edge of bed, use of bed pad to scoot hips forward and truncal assist to upright. Cues for use of bed rail    Transfers Overall transfer level: Needs assistance Equipment used:  Rolling walker (2 wheels), None Transfers: Sit to/from Stand, Bed to chair/wheelchair/BSC Sit to Stand: Mod assist Stand pivot transfers: Mod assist         General transfer comment: Heavy modA to transition to standing to RW with retropulsion. Sat back down on edge of bed and performed face to face transfer to pivot over to chair towards left.    Ambulation/Gait               General Gait Details: unable   Stairs             Wheelchair Mobility     Tilt Bed    Modified Rankin (Stroke Patients Only) Modified Rankin (Stroke Patients Only) Pre-Morbid Rankin Score: No symptoms Modified Rankin: Severe disability     Balance Overall balance assessment: Needs assistance Sitting-balance support: Feet supported, Bilateral upper extremity supported Sitting balance-Leahy Scale: Poor Sitting balance - Comments: Right lateral lean   Standing balance support: Bilateral upper extremity supported Standing balance-Leahy Scale: Zero                              Cognition Arousal: Alert Behavior During Therapy: Flat affect Overall Cognitive Status: Impaired/Different from baseline Area of Impairment: Attention, Memory, Following commands, Safety/judgement, Awareness, Problem solving                   Current Attention Level: Sustained Memory: Decreased short-term memory Following Commands: Follows one step commands with increased time Safety/Judgement: Decreased awareness of safety, Decreased awareness of deficits Awareness: Intellectual Problem Solving: Slow processing, Requires verbal cues, Requires tactile cues General Comments: Follows simple commands  Exercises General Exercises - Lower Extremity Ankle Circles/Pumps: AROM, Both, 10 reps, Supine Heel Slides: AROM, Both, 5 reps, Supine Straight Leg Raises: AAROM, Both, 5 reps, Supine Other Exercises Other Exercises: Forwards taps x 3 BUE's to promote anterior weight shift Other  Exercises: Sitting EOB: work on midline    General Comments        Pertinent Vitals/Pain Pain Assessment Pain Assessment: No/denies pain    Home Living                          Prior Function            PT Goals (current goals can now be found in the care plan section) Acute Rehab PT Goals Potential to Achieve Goals: Good    Frequency    Min 1X/week      PT Plan      Co-evaluation              AM-PAC PT "6 Clicks" Mobility   Outcome Measure  Help needed turning from your back to your side while in a flat bed without using bedrails?: A Little Help needed moving from lying on your back to sitting on the side of a flat bed without using bedrails?: A Lot Help needed moving to and from a bed to a chair (including a wheelchair)?: A Lot Help needed standing up from a chair using your arms (e.g., wheelchair or bedside chair)?: A Lot Help needed to walk in hospital room?: Total Help needed climbing 3-5 steps with a railing? : Total 6 Click Score: 11    End of Session Equipment Utilized During Treatment: Gait belt Activity Tolerance: Patient tolerated treatment well Patient left: in chair;with call bell/phone within reach;with chair alarm set Nurse Communication: Mobility status;Need for lift equipment PT Visit Diagnosis: Unsteadiness on feet (R26.81);History of falling (Z91.81);Difficulty in walking, not elsewhere classified (R26.2);Muscle weakness (generalized) (M62.81);Pain Pain - Right/Left: Left Pain - part of body: Shoulder     Time: 4098-1191 PT Time Calculation (min) (ACUTE ONLY): 31 min  Charges:    $Therapeutic Activity: 23-37 mins PT General Charges $$ ACUTE PT VISIT: 1 Visit                     Lillia Pauls, PT, DPT Acute Rehabilitation Services Office [phone]    Norval Morton 04/06/2023, 12:51 PM

## 2023-04-06 NOTE — Assessment & Plan Note (Addendum)
Elevated to 1.5 on admission, now resolved 0.97. Hypernatremia has responded to IV fluids. Na is now 135 > 131.  -Continue to regular diet

## 2023-04-06 NOTE — Assessment & Plan Note (Addendum)
Presented after found down for unknown time.  MRI Brain shows acute right corona radiata and basal ganglia infarct. CTA Head/Neck-multiple stenosis in M1/M2.  S/p MBS, SLP recommending regular diet with nectar thick fluid.  -Appreciate neurology recs -signing off at this time -DAPT with ASA and Plavix x 3 months, monotherapy ASA to follow -Zio patch on discharge x 1 month -Frequent Neuro checks -Delirium precautions, fall precautions

## 2023-04-06 NOTE — Progress Notes (Signed)
Daily Progress Note Intern Pager: [phone]  Patient name: Marco Alvarado Medical record number: [id number] Date of birth: 1945-06-30 Age: 78 y.o. Gender: male  Primary Care Provider: Theodis Shove, DO Consultants: ID (s/o), Neurology Code Status: FULL   Pt Overview and Major Events to Date:  1/22 admitted 1/24 MR Brain R Basal ganglia stroke  Assessment and Plan:  Marco Alvarado PMH LBBB, mild nonischemic cardiomyopathy, back surgery is a 78 y.o. male presenting with fall, found down at his home after unknown period of time. 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 Neurology on board.  SNF pending. Assessment & Plan Fall  Infarction of right basal ganglia (HCC) Presented after found down for unknown time.  MRI Brain shows acute right corona radiata and basal ganglia infarct. CTA Head/Neck-multiple stenosis in M1/M2.  S/p MBS, SLP recommending regular diet with nectar thick fluid.  -Appreciate neurology recs -signing off at this time -DAPT with ASA and Plavix x 3 months, monotherapy ASA to follow -Zio patch on discharge x 1 month -Frequent Neuro checks -Delirium precautions, fall precautions Elevated serum creatinine  Hypernatremia Elevated to 1.5 on admission, now resolved 0.97. Hypernatremia has responded to IV fluids. Na is now 135 > 131.  -Continue to regular diet Pressure ulcer Pressure ulcer noted on the back on admission.  This does appear to be secondary to his fall and subsequent laying on the ground for several days, with irritation likely secondary to contact with urine/feces.   - WOC has seen and made recommendations  - [name]'s butt cream - Continue to monitor for infectious symptoms.  Chronic and Stable Issues: NICM: Resume Coreg, Holding Entresto  HFmrEF  LBBB: Daily weights, Strict I/Os. Bedside echo from 12/2022 with LVEF 45-50%. Sees cardiology with [facility].  Housing concern  worsening weakness: Patient lives alone and is unable to perform ADLs.   Worsening memory loss and difficulty with mobility over the last year.  SNF recommended.  FEN/GI: Regular, nectar thick PPx: Lovenox Dispo: SNF pending placement  Subjective:  Patient feeling well this morning.  No reported changes.  Has been eating well.  Is on board with going to SNF today.  Objective: Temp:  [97.5 F (36.4 C)-99.3 F (37.4 C)] 98.9 F (37.2 C) (01/27 0459) Pulse Rate:  [84-93] 90 (01/27 0459) Resp:  [16-18] 18 (01/26 2112) BP: (113-134)/(66-85) 115/85 (01/27 0459) SpO2:  [93 %-98 %] 98 % (01/27 0459) Weight:  [60 kg] 60 kg (01/27 0500) Physical Exam: General: Well-appearing. Resting comfortably in room. CV: Normal S1/S2. No extra heart sounds. Warm and well-perfused. Pulm: Breathing comfortably on room air. CTAB anteriorly. No increased WOB. Abd: Soft, non-tender, non-distended. Skin/Ext:  Warm, dry.  No LE edema. Psych: Pleasant and appropriate.   Laboratory: Most recent CBC Lab Results  Component Value Date   WBC 9.1 04/05/2023   HGB 12.1 (L) 04/05/2023   HCT 36.2 (L) 04/05/2023   MCV 89.8 04/05/2023   PLT 220 04/05/2023   Most recent BMP    Latest Ref Rng & Units 04/06/2023    4:51 AM  BMP  Glucose 70 - 99 mg/dL 147   BUN 8 - 23 mg/dL 32   Creatinine 8.29 - 1.24 mg/dL 5.62   Sodium 130 - 865 mmol/L 131   Potassium 3.5 - 5.1 mmol/L 3.9   Chloride 98 - 111 mmol/L 102   CO2 22 - 32 mmol/L 20   Calcium 8.9 - 10.3 mg/dL 8.5     Marco Quale, MD 04/06/2023,  7:13 AM  PGY-1, [facility] FPTS Intern pager: [phone], text pages welcome Secure chat group [facility] Teaching Service

## 2023-04-07 DIAGNOSIS — I639 Cerebral infarction, unspecified: Secondary | ICD-10-CM | POA: Diagnosis not present

## 2023-04-07 NOTE — Assessment & Plan Note (Addendum)
Presented after found down for unknown time.  MRI Brain shows acute right corona radiata and basal ganglia infarct. CTA Head/Neck-multiple stenosis in M1/M2.  S/p MBS, SLP recommending regular diet with nectar thick fluid.  -Appreciate neurology recs -signed off at this time -DAPT with ASA and Plavix x 3 months, monotherapy ASA to follow -Recommend Zio patch on discharge x 1 month -Delirium precautions, fall precautions

## 2023-04-07 NOTE — Discharge Summary (Signed)
Family Medicine Teaching [facility] Discharge Summary  Patient name: Marco Alvarado Medical record number: [id number] Date of birth: 04-Apr-1945 Age: 78 y.o. Gender: male Date of Admission: 04/01/2023  Date of Discharge: 04/07/2023 Admitting Physician: Alfredo Martinez, MD  Primary Care Provider: Lorrene Reid Prince Solian, DO Consultants: Neurology   Indication for Hospitalization: Fall  Stroke  Discharge Diagnoses/Problem List:  Principal Problem for Admission: Fall  Stroke Other Problems addressed during stay:  Principal Problem:   Fall  Infarction of right basal ganglia (HCC) Active Problems:   Memory loss   Elevated serum creatinine  Hypernatremia   Pressure ulcer   Fall on same level   Brief Hospital Course:  Marco Alvarado is a 78 y.o.male with a history of LBBB, mild nonischemic cardiomyopathy, ACDF, lumbar fusion who was admitted to the [facility] at [facility] for fall, found down in his home. His hospital course is detailed below:   Fall Patient was found down in his home. Head CT negative, C-spine CT negative, CXR overall unremarkable. No obvious abnormalities or notable deformities. Patient ambulates with a walker at baseline. PT/OT's worked with patient while admitted and recommended SNF to follow.   Encephalopathy  Stroke Patient with memory loss and difficulty with ADLs over the last year.  Appeared encephalopathic when he was found down. Initial CT head negative. Brain MR showed acute infarct within the right corona radiata and right basal ganglia.  Per neurology, patient started on DAPT for 3 months with plan for aspirin monotherapy to follow.  Neuro recommended Zio patch for 30 days after discharge. SLP recommended regular diet with thickened fluids.   Pressure ulcer Back ulcer likely secondary to immobility after a fall for several days. Not acutely infected appearing. Wound care followed patient while admitted.  Hypernatremia Na increased to 151 during  admission, responded well to fluids and normalized prior to discharge.   Blood Cx Contaminant E. Faecalis and Staph Epi detected in separate blood culture bottles. Ampicillin given for 1 day. Per ID, these are likely contaminants and antibiotics were not continued.  Other chronic conditions were medically managed with home medications and formulary alternatives as necessary (NICM, HFmrEF  LBBB, weakness)  PCP Follow-up Recommendations: Neuro recommended Zio patch for 30 days on discharge. Please ensure this is completed. Patient declines statin for personal reasons. Please reassess.  Ensure DAPT for 3 months followed by ASA monotherapy   Disposition: SNF  Discharge Condition: Improved, stable  Discharge Exam:  Vitals:   04/07/23 0559 04/07/23 0746  BP: 131/75 (!) 142/78  Pulse: 88 86  Resp: 17 17  Temp: 98.5 F (36.9 C) 98.8 F (37.1 C)  SpO2: 95% 97%   General: Well-appearing. Resting comfortably in chair. CV: Normal S1/S2. No extra heart sounds. Warm and well-perfused. Pulm: Breathing comfortably on room air. CTAB. No increased WOB. Abd: Soft, non-tender, non-distended. Skin/Ext:  Warm, dry. No LE edema.  Psych: Pleasant and appropriate.   Significant Procedures:  MBS Study 1/24: Regular solids but with nectar thick liquids and ongoing SLP f/u for dysphagia intervention   Significant Labs and Imaging:  Lab Results  Component Value Date   WBC 9.1 04/05/2023   HGB 12.1 (L) 04/05/2023   HCT 36.2 (L) 04/05/2023   MCV 89.8 04/05/2023   PLT 220 04/05/2023   Recent Labs  Lab 04/06/23 0451  NA 131*  K 3.9  CL 102  CO2 20*  GLUCOSE 112*  BUN 32*  CREATININE 0.97  CALCIUM 8.5*   Lipid Panel  Component Value Date/Time   CHOL 176 04/04/2023 0624   TRIG 126 04/04/2023 0624   HDL 47 04/04/2023 0624   CHOLHDL 3.7 04/04/2023 0624   VLDL 25 04/04/2023 0624   LDLCALC 104 (H) 04/04/2023 0624   A1c 5.9 TSH wnl  Ammonia wnl  CK 439 Flu, covid negative (Blood  c: E. Faecalis and Staph Epi detected in separate blood culture bottles thought to be contaminant by ID)  CXR 1/22: No active disease.  CT Head & CT Cervical 1/22: No acute intracranial abnormality. No acute fracture in the cervical spine. Lumbar XR 1/23: No acute bony abnormality. CTPE 1/23: Negative for acute pulmonary embolism. No acute abnormality in the chest. MR Brain 1/24: 3.3 x 3.0 cm acute infarct within the right corona radiata and right basal ganglia. Background parenchymal atrophy, advanced chronic small vessel ischemic disease and chronic infarcts Chronic microhemorrhages scattered within the supratentorial and infratentorial brain. 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   Results/Tests Pending at Time of Discharge: N/a  Discharge Medications:  Allergies as of 04/07/2023   No Known Allergies      Medication List     PAUSE taking these medications    methocarbamol 500 MG tablet Wait to take this until your doctor or other care provider tells you to start again. Commonly known as: ROBAXIN Take 1 tablet (500 mg total) by mouth every 6 (six) hours as needed for muscle spasms.   sacubitril-valsartan 24-26 MG Wait to take this until your doctor or other care provider tells you to start again. Commonly known as: ENTRESTO Take 1 tablet by mouth 2 (two) times daily.       TAKE these medications    aspirin EC 81 MG tablet Take 1 tablet (81 mg total) by mouth daily. Swallow whole.   carvedilol 3.125 MG tablet Commonly known as: COREG Take 3.125 mg by mouth 2 (two) times daily with a meal.   clopidogrel 75 MG tablet Commonly known as: PLAVIX Take 1 tablet (75 mg total) by mouth daily.   [name]'s butt cream Crea Apply 1 Application topically 2 (two) times daily.        Discharge Instructions: Please refer to Patient Instructions section of EMR for full details.  Patient was counseled important signs and symptoms that should  prompt return to medical care, changes in medications, dietary instructions, activity restrictions, and follow up appointments.   Follow-Up Appointments:  Future Appointments  Date Time Provider Department Center  05/25/2023  2:00 PM Dohmeier, Porfirio Mylar, MD [facility] None     [name], MD 04/07/2023, 10:45 AM PGY-1, [facility]

## 2023-04-07 NOTE — Progress Notes (Signed)
Report called to [facility]. Spoke to nurse, Deon Pilling, and gave report.

## 2023-04-07 NOTE — Care Management Important Message (Signed)
Important Message  Patient Details  Name: Marco Alvarado MRN: [id number] Date of Birth: 1945/10/08   Important Message Given:  Yes - Medicare IM     Dorena Bodo 04/07/2023, 4:39 PM

## 2023-04-07 NOTE — Assessment & Plan Note (Addendum)
Elevated to 1.5 on admission, now resolved 0.97. Hypernatremia has responded to IV fluids. Na is now 135 > 131.  -Continue regular diet with

## 2023-04-07 NOTE — TOC Transition Note (Signed)
Transition of Care [facility]) - Discharge Note   Patient Details  Name: Marco Alvarado MRN: [id number] Date of Birth: 04/16/1945  Transition of Care [facility]) CM/SW Contact:  Lorri Frederick, LCSW Phone Number: 04/07/2023, 11:08 AM   Clinical Narrative:   Pt discharging to [facility].  RN call report to [phone].     1000: CSW confirmed with Nikki[facility] that they can receive pt today.   Final next level of care: Skilled Nursing Facility Barriers to Discharge: Barriers Resolved   Patient Goals and CMS Choice Patient states their goals for this hospitalization and ongoing recovery are:: my health CMS Medicare.gov Compare Post Acute Care list provided to:: Patient Choice offered to / list presented to : Patient, Spouse      Discharge Placement              Patient chooses bed at: [facility] Patient to be transferred to facility by: ptar Name of family member notified: wife dale Patient and family notified of of transfer: 04/07/23  Discharge Plan and Services Additional resources added to the After Visit Summary for   In-house Referral: Clinical Social Work   Post Acute Care Choice: Skilled Nursing Facility                               Social Drivers of Health (SDOH) Interventions SDOH Screenings   Food Insecurity: No Food Insecurity (04/02/2023)  Housing: Low Risk  (04/02/2023)  Transportation Needs: No Transportation Needs (04/02/2023)  Utilities: Not At Risk (04/02/2023)  Financial Resource Strain: Low Risk  (10/02/2022)   Received from [facility]  Social Connections: Socially Isolated (04/02/2023)  Tobacco Use: Medium Risk (04/02/2023)     Readmission Risk Interventions     No data to display

## 2023-04-07 NOTE — Assessment & Plan Note (Signed)
Pressure ulcer noted on the back on admission.  This does appear to be secondary to his fall and subsequent laying on the ground for several days, with irritation likely secondary to contact with urine/feces.   - WOC has seen and made recommendations  - [name]'s butt cream - Continue to monitor for infectious symptoms.

## 2023-04-07 NOTE — Progress Notes (Signed)
Daily Progress Note Intern Pager: [phone]  Patient name: Marco Alvarado Medical record number: [id number] Date of birth: 02-20-1946 Age: 78 y.o. 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 male presenting with fall, found down at his home after unknown period of time. 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 Neurology signed off. Medically stable, plan for transfer to SNF today.  Assessment & Plan Fall  Infarction of right basal ganglia (HCC) Presented after found down for unknown time.  MRI Brain shows acute right corona radiata and basal ganglia infarct. CTA Head/Neck-multiple stenosis in M1/M2.  S/p MBS, SLP recommending regular diet with nectar thick fluid.  -Appreciate neurology recs -signed off at this time -DAPT with ASA and Plavix x 3 months, monotherapy ASA to follow -Recommend Zio patch on discharge x 1 month -Delirium precautions, fall precautions Elevated serum creatinine  Hypernatremia Elevated to 1.5 on admission, now resolved 0.97. Hypernatremia has responded to IV fluids. Na is now 135 > 131.  -Continue regular diet with  Pressure ulcer Pressure ulcer noted on the back on admission.  This does appear to be secondary to his fall and subsequent laying on the ground for several days, with irritation likely secondary to contact with urine/feces.   - WOC has seen and made recommendations  - [name]'s butt cream - Continue to monitor for infectious symptoms.   Chronic and Stable Issues: NICM: Cont Coreg, Holding Entresto  HFmrEF  LBBB: Daily weights, Strict I/Os. Bedside echo from 12/2022 with LVEF 45-50%. Sees cardiology with [facility].  Housing concern  worsening weakness: Patient lives alone  and is unable to perform ADLs.  Worsening memory loss and difficulty with mobility over the last year.  SNF recommended.  FEN/GI: Regular with thickened fluids PPx: Lovenox Dispo: Transfer SNF today  Subjective:  Patient doing well today - no changes reported, no questions this morning.   Objective: Temp:  [98 F (36.7 C)-98.8 F (37.1 C)] 98.8 F (37.1 C) (01/28 0746) Pulse Rate:  [84-95] 86 (01/28 0746) Resp:  [17-18] 17 (01/28 0746) BP: (110-150)/(63-83) 142/78 (01/28 0746) SpO2:  [95 %-100 %] 97 % (01/28 0746) Weight:  [60 kg] 60 kg (01/28 0446) Physical Exam: General: Well-appearing. Resting comfortably in chair. CV: Normal S1/S2. No extra heart sounds. Warm and well-perfused. Pulm: Breathing comfortably on room air. CTAB. No increased WOB. Abd: Soft, non-tender, non-distended. Skin/Ext:  Warm, dry. No LE edema.  Psych: Pleasant and appropriate.   Laboratory: Most recent CBC Lab Results  Component Value Date   WBC 9.1 04/05/2023   HGB 12.1 (L) 04/05/2023   HCT 36.2 (L) 04/05/2023   MCV 89.8 04/05/2023   PLT 220 04/05/2023   Most recent BMP    Latest Ref Rng & Units 04/06/2023    4:51 AM  BMP  Glucose 70 - 99 mg/dL 578   BUN 8 - 23 mg/dL 32   Creatinine 4.69 - 1.24 mg/dL 6.29   Sodium 528 - 413 mmol/L 131   Potassium 3.5 - 5.1 mmol/L 3.9   Chloride 98 - 111 mmol/L 102   CO2 22 - 32 mmol/L 20   Calcium 8.9 - 10.3 mg/dL 8.5    [name], MD 04/07/2023, 8:17 AM  PGY-1,  [facility] Family Medicine FPTS Intern pager: [phone], text pages welcome Secure chat group [facility] Teaching Service

## 2023-04-07 NOTE — Progress Notes (Signed)
AVS completed and printed; AVS placed with patient chart

## 2023-04-07 NOTE — Progress Notes (Signed)
Occupational Therapy Treatment Patient Details Name: Marco Alvarado MRN: [id number] DOB: 01/21/1946 Today's Date: 04/07/2023   History of present illness 78 y.o. male presents 04/01/23 after a fall at home with multiple days on the floor, encephalopathic changes, pressure ulcer on back. MRI with 3.3 x 3.0 cm acute infarct within right corona radiata and right basal ganglia. 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 Patient required mod assist to get to EOB with assistance for trunk and scooting towards EOB. Patient performed 2 stands from EOB with mod assist to stand and for balance. Face to face transfer performed to recliner for safety. Patient performed self feeding seated in recliner with assistance to open contains and supervision for grooming. Patient will benefit from continued inpatient follow up therapy, <3 hours/day. Acute OT to continue to follow to address established goals to facilitate DC to next venue of care.       If plan is discharge home, recommend the following:  A lot of help with bathing/dressing/bathroom;A lot of help with walking and/or transfers;Direct supervision/assist for financial management;Assist for transportation;Assistance with cooking/housework;Direct supervision/assist for medications management   Equipment Recommendations  None recommended by OT    Recommendations for Other Services      Precautions / Restrictions Precautions Precautions: Fall Restrictions Weight Bearing Restrictions Per Provider Order: No       Mobility Bed Mobility Overal bed mobility: Needs Assistance Bed Mobility: Supine to Sit     Supine to sit: Mod assist     General bed mobility comments: able to assist with BLEs and required mod assist for trunk    Transfers Overall transfer level: Needs assistance Equipment used: Rolling walker (2 wheels),  None Transfers: Sit to/from Stand, Bed to chair/wheelchair/BSC Sit to Stand: Mod assist Stand pivot transfers: Mod assist         General transfer comment: performed 2 stands from EOB before performing stand pivot transfer with face to face technique     Balance Overall balance assessment: Needs assistance Sitting-balance support: Feet supported, Bilateral upper extremity supported Sitting balance-Leahy Scale: Poor Sitting balance - Comments: CGA to supervision   Standing balance support: Bilateral upper extremity supported Standing balance-Leahy Scale: Zero Standing balance comment: assistance with balance due to forward flexion at hips                           ADL either performed or assessed with clinical judgement   ADL Overall ADL's : Needs assistance/impaired Eating/Feeding: Supervision/ safety;Sitting Eating/Feeding Details (indicate cue type and reason): assistance to open containers Grooming: Wash/dry hands;Wash/dry face;Oral care;Supervision/safety;Sitting Grooming Details (indicate cue type and reason): able to use LUE as gross assist                                    Extremity/Trunk Assessment              Vision       Perception     Praxis      Cognition Arousal: Alert Behavior During Therapy: Flat affect Overall Cognitive Status: Impaired/Different from baseline Area of Impairment: Attention, Memory, Following commands, Safety/judgement, Awareness, Problem solving                   Current Attention Level: Sustained Memory:  Decreased short-term memory Following Commands: Follows one step commands with increased time Safety/Judgement: Decreased awareness of safety, Decreased awareness of deficits Awareness: Intellectual Problem Solving: Slow processing, Requires verbal cues, Requires tactile cues          Exercises      Shoulder Instructions       General Comments      Pertinent Vitals/ Pain        Pain Assessment Pain Assessment: No/denies pain Pain Intervention(s): Monitored during session  Home Living                                          Prior Functioning/Environment              Frequency  Min 1X/week        Progress Toward Goals  OT Goals(current goals can now be found in the care plan section)  Progress towards OT goals: Progressing toward goals  Acute Rehab OT Goals Patient Stated Goal: get better OT Goal Formulation: With patient Time For Goal Achievement: 05/14/23 Potential to Achieve Goals: Fair  Plan      Co-evaluation                 AM-PAC OT "6 Clicks" Daily Activity     Outcome Measure   Help from another person eating meals?: A Little Help from another person taking care of personal grooming?: A Little Help from another person toileting, which includes using toliet, bedpan, or urinal?: A Lot Help from another person bathing (including washing, rinsing, drying)?: A Lot Help from another person to put on and taking off regular upper body clothing?: A Lot Help from another person to put on and taking off regular lower body clothing?: A Lot 6 Click Score: 14    End of Session Equipment Utilized During Treatment: Gait belt;Rolling walker (2 wheels)  OT Visit Diagnosis: Unsteadiness on feet (R26.81);Repeated falls (R29.6);Muscle weakness (generalized) (M62.81);History of falling (Z91.81);Other symptoms and signs involving cognitive function   Activity Tolerance Patient tolerated treatment well   Patient Left in chair;with call bell/phone within reach;with chair alarm set   Nurse Communication Mobility status        Time: 1478-2956 OT Time Calculation (min): 34 min  Charges: OT General Charges $OT Visit: 1 Visit OT Treatments $Self Care/Home Management : 8-22 mins $Therapeutic Activity: 8-22 mins  Alfonse Flavors, OTA Acute Rehabilitation Services  Office [phone]   Dewain Penning 04/07/2023, 11:48 AM

## 2023-04-13 ENCOUNTER — Emergency Department (HOSPITAL_COMMUNITY): Payer: Medicare Other

## 2023-04-13 ENCOUNTER — Other Ambulatory Visit: Payer: Self-pay

## 2023-04-13 ENCOUNTER — Inpatient Hospital Stay (HOSPITAL_COMMUNITY)
Admission: EM | Admit: 2023-04-13 | Discharge: 2023-05-09 | DRG: 951 | Disposition: E | Payer: Medicare Other | Source: Skilled Nursing Facility | Attending: Family Medicine | Admitting: Family Medicine

## 2023-04-13 ENCOUNTER — Encounter (HOSPITAL_COMMUNITY): Payer: Self-pay

## 2023-04-13 DIAGNOSIS — R64 Cachexia: Secondary | ICD-10-CM | POA: Diagnosis present

## 2023-04-13 DIAGNOSIS — D649 Anemia, unspecified: Secondary | ICD-10-CM | POA: Diagnosis not present

## 2023-04-13 DIAGNOSIS — Z531 Procedure and treatment not carried out because of patient's decision for reasons of belief and group pressure: Secondary | ICD-10-CM | POA: Diagnosis present

## 2023-04-13 DIAGNOSIS — I428 Other cardiomyopathies: Secondary | ICD-10-CM | POA: Diagnosis present

## 2023-04-13 DIAGNOSIS — G319 Degenerative disease of nervous system, unspecified: Secondary | ICD-10-CM | POA: Diagnosis present

## 2023-04-13 DIAGNOSIS — D62 Acute posthemorrhagic anemia: Secondary | ICD-10-CM | POA: Diagnosis present

## 2023-04-13 DIAGNOSIS — I4510 Unspecified right bundle-branch block: Secondary | ICD-10-CM | POA: Diagnosis present

## 2023-04-13 DIAGNOSIS — Z905 Acquired absence of kidney: Secondary | ICD-10-CM

## 2023-04-13 DIAGNOSIS — I5022 Chronic systolic (congestive) heart failure: Secondary | ICD-10-CM | POA: Diagnosis present

## 2023-04-13 DIAGNOSIS — L89326 Pressure-induced deep tissue damage of left buttock: Secondary | ICD-10-CM | POA: Diagnosis present

## 2023-04-13 DIAGNOSIS — L89316 Pressure-induced deep tissue damage of right buttock: Secondary | ICD-10-CM | POA: Diagnosis present

## 2023-04-13 DIAGNOSIS — M4712 Other spondylosis with myelopathy, cervical region: Secondary | ICD-10-CM | POA: Diagnosis present

## 2023-04-13 DIAGNOSIS — Z7189 Other specified counseling: Secondary | ICD-10-CM

## 2023-04-13 DIAGNOSIS — Z6820 Body mass index (BMI) 20.0-20.9, adult: Secondary | ICD-10-CM

## 2023-04-13 DIAGNOSIS — E861 Hypovolemia: Secondary | ICD-10-CM | POA: Diagnosis present

## 2023-04-13 DIAGNOSIS — Z1152 Encounter for screening for COVID-19: Secondary | ICD-10-CM

## 2023-04-13 DIAGNOSIS — Z981 Arthrodesis status: Secondary | ICD-10-CM

## 2023-04-13 DIAGNOSIS — L89116 Pressure-induced deep tissue damage of right upper back: Secondary | ICD-10-CM | POA: Diagnosis present

## 2023-04-13 DIAGNOSIS — Z515 Encounter for palliative care: Secondary | ICD-10-CM | POA: Diagnosis not present

## 2023-04-13 DIAGNOSIS — M1990 Unspecified osteoarthritis, unspecified site: Secondary | ICD-10-CM | POA: Diagnosis present

## 2023-04-13 DIAGNOSIS — Z8711 Personal history of peptic ulcer disease: Secondary | ICD-10-CM

## 2023-04-13 DIAGNOSIS — F0390 Unspecified dementia without behavioral disturbance: Secondary | ICD-10-CM | POA: Diagnosis present

## 2023-04-13 DIAGNOSIS — Z66 Do not resuscitate: Secondary | ICD-10-CM | POA: Diagnosis present

## 2023-04-13 DIAGNOSIS — K922 Gastrointestinal hemorrhage, unspecified: Principal | ICD-10-CM | POA: Diagnosis present

## 2023-04-13 LAB — I-STAT CG4 LACTIC ACID, ED: Lactic Acid, Venous: 1.8 mmol/L (ref 0.5–1.9)

## 2023-04-13 LAB — COMPREHENSIVE METABOLIC PANEL
ALT: 24 U/L (ref 0–44)
AST: 32 U/L (ref 15–41)
Albumin: 2.3 g/dL — ABNORMAL LOW (ref 3.5–5.0)
Alkaline Phosphatase: 57 U/L (ref 38–126)
Anion gap: 12 (ref 5–15)
BUN: 25 mg/dL — ABNORMAL HIGH (ref 8–23)
CO2: 21 mmol/L — ABNORMAL LOW (ref 22–32)
Calcium: 8.4 mg/dL — ABNORMAL LOW (ref 8.9–10.3)
Chloride: 111 mmol/L (ref 98–111)
Creatinine, Ser: 1.1 mg/dL (ref 0.61–1.24)
GFR, Estimated: >60 mL/min (ref >60)
Glucose, Bld: 114 mg/dL — ABNORMAL HIGH (ref 70–99)
Potassium: 3.9 mmol/L (ref 3.5–5.1)
Sodium: 144 mmol/L (ref 135–145)
Total Bilirubin: 0.4 mg/dL (ref 0.0–1.2)
Total Protein: 5.3 g/dL — ABNORMAL LOW (ref 6.5–8.1)

## 2023-04-13 LAB — CBC WITH DIFFERENTIAL/PLATELET
Abs Immature Granulocytes: 0.8 10*3/uL — ABNORMAL HIGH (ref 0.00–0.07)
Basophils Absolute: 0 10*3/uL (ref 0.0–0.1)
Basophils Relative: 0 %
Eosinophils Absolute: 0.1 10*3/uL (ref 0.0–0.5)
Eosinophils Relative: 0 %
HCT: 14.3 % — ABNORMAL LOW (ref 39.0–52.0)
Hemoglobin: 4.5 g/dL — CL (ref 13.0–17.0)
Immature Granulocytes: 5 %
Lymphocytes Relative: 15 %
Lymphs Abs: 2.5 10*3/uL (ref 0.7–4.0)
MCH: 31.7 pg (ref 26.0–34.0)
MCHC: 31.5 g/dL (ref 30.0–36.0)
MCV: 100.7 fL — ABNORMAL HIGH (ref 80.0–100.0)
Monocytes Absolute: 1.3 10*3/uL — ABNORMAL HIGH (ref 0.1–1.0)
Monocytes Relative: 8 %
Neutro Abs: 11.9 10*3/uL — ABNORMAL HIGH (ref 1.7–7.7)
Neutrophils Relative %: 72 %
Platelets: 341 10*3/uL (ref 150–400)
RBC: 1.42 MIL/uL — ABNORMAL LOW (ref 4.22–5.81)
RDW: 17.9 % — ABNORMAL HIGH (ref 11.5–15.5)
WBC: 16.6 10*3/uL — ABNORMAL HIGH (ref 4.0–10.5)
nRBC: 1.8 % — ABNORMAL HIGH (ref 0.0–0.2)

## 2023-04-13 LAB — URINALYSIS, W/ REFLEX TO CULTURE (INFECTION SUSPECTED)
Bacteria, UA: NONE SEEN
Bilirubin Urine: NEGATIVE
Glucose, UA: NEGATIVE mg/dL
Hgb urine dipstick: NEGATIVE
Ketones, ur: NEGATIVE mg/dL
Leukocytes,Ua: NEGATIVE
Nitrite: NEGATIVE
Protein, ur: NEGATIVE mg/dL
Specific Gravity, Urine: 1.017 (ref 1.005–1.030)
pH: 5 (ref 5.0–8.0)

## 2023-04-13 LAB — PROTIME-INR
INR: 1.1 (ref 0.8–1.2)
Prothrombin Time: 14 s (ref 11.4–15.2)

## 2023-04-13 LAB — RESP PANEL BY RT-PCR (RSV, FLU A&B, COVID)  RVPGX2
Influenza A by PCR: NEGATIVE
Influenza B by PCR: NEGATIVE
Resp Syncytial Virus by PCR: NEGATIVE
SARS Coronavirus 2 by RT PCR: NEGATIVE

## 2023-04-13 LAB — POC OCCULT BLOOD, ED: Fecal Occult Bld: POSITIVE — AB

## 2023-04-13 LAB — APTT: aPTT: 28 s (ref 24–36)

## 2023-04-13 MED ORDER — VANCOMYCIN HCL IN DEXTROSE 1-5 GM/200ML-% IV SOLN
1000.0000 mg | INTRAVENOUS | Status: DC
Start: 2023-04-14 — End: 2023-04-14

## 2023-04-13 MED ORDER — SODIUM CHLORIDE 0.9 % IV SOLN
2.0000 g | Freq: Two times a day (BID) | INTRAVENOUS | Status: DC
  Administered 2023-04-13: 2 g via INTRAVENOUS
  Filled 2023-04-13 (×2): qty 12.5

## 2023-04-13 MED ORDER — LACTATED RINGERS IV BOLUS
1000.0000 mL | Freq: Once | INTRAVENOUS | Status: AC
  Administered 2023-04-13: 1000 mL via INTRAVENOUS

## 2023-04-13 MED ORDER — MORPHINE SULFATE (PF) 2 MG/ML IV SOLN
2.0000 mg | INTRAVENOUS | Status: DC | PRN
  Administered 2023-04-13: 2 mg via INTRAVENOUS
  Filled 2023-04-13: qty 1

## 2023-04-13 MED ORDER — VANCOMYCIN HCL 1250 MG/250ML IV SOLN
1250.0000 mg | Freq: Once | INTRAVENOUS | Status: AC
  Administered 2023-04-13: 1250 mg via INTRAVENOUS
  Filled 2023-04-13: qty 250

## 2023-04-13 MED ORDER — ACETAMINOPHEN 650 MG RE SUPP
650.0000 mg | Freq: Once | RECTAL | Status: AC
  Administered 2023-04-13: 650 mg via RECTAL
  Filled 2023-04-13: qty 1

## 2023-04-13 MED ORDER — IOHEXOL 350 MG/ML SOLN
75.0000 mL | Freq: Once | INTRAVENOUS | Status: AC | PRN
  Administered 2023-04-13: 75 mL via INTRAVENOUS

## 2023-04-13 NOTE — ED Provider Notes (Incomplete)
[place] EMERGENCY DEPARTMENT AT [facility] Provider Note  MDM   HPI/ROS:  Marco Alvarado is a 78 y.o. male with pertinent past medical history of LBBB, mild nonischemic cardiomyopathy, ACDF, lumbar fusion, dementia who presents for evaluation of altered mental status. Patient daughter states when she arrived at patient's nursing facility today EMS was already there and they had requested transfer to the ED because they were concerned that he had an infection due to elevated heart rate. Patient's family reports he has continued to be minimally responsive since he left the hospital.  No known fevers, chills, cough, congestion, diarrhea, constipation, vomiting as far as they are aware.  Patient's wife states that his sacral wound looks improved from time of discharge.   Per chart review, patient was just admitted to the family medicine teaching service 1/22 - 04/07/2023 after a fall, found to have a right basal ganglia infarct.  Patient was started on DAPT for 3 months with plan for aspirin monotherapy after fall.  Had blood culture growing E faecalis and staph epi that was felt most likely contaminant.   Physical exam is notable for: - Frail, elderly male, cachectic appearance -- Will provide one-word answers with significant prompting/encouragement -- Follow simple commands in upper extremities and localizes to pain in all 4 extremities -- Dry, cracked lips -- Coarse breath sounds throughout -- Sacral ulcer without evidence of acute infection  On my initial evaluation, patient is:  -Vital signs stable. Patient afebrile, tachycardic and tachypneic but overall hemodynamically stable, chronically ill but not acutely toxic appearing -Additional history obtained from patient's wife and daughter, review of prior records -Subsequent rectal temperature obtained and noted to be febrile to 100.5 F  With the patient's presentation of altered mental status, most likely diagnosis is delerium  2/2 infectious etiology (UTI/CAP/URI) vs metabolic abnormality (Na/K/Mg/Ca) vs primary CNS etiology (recurrent/worsened CVA, ICH) nonspecific etiology.  He is meeting 3/4 SIRS criteria so full sepsis workup ordered as below.  Other diagnoses were considered including (but not limited to) intracranial mass, critical dehydration, heptatic dysfunction, uremia, hypercarbia, intoxication, endrocrine abnormality, toxidrome. These are considered less likely due to history of present illness and physical exam findings.    Initial Plan:  CTH to evaluate for intracranial etiology of patient's symptoms  Full sepsis workup including labs with lactic acid, UA/UCX, chest x-ray, blood cultures x 2, empiric antibiotics  EKG to evaluate for cardiac pathology Objective evaluation as below reviewed   Initial Study Results:   Laboratory  All laboratory results reviewed without evidence of clinically relevant pathology.   Exceptions include: WBC 16.6, hemoglobin 4.5, hematocrit 14.3, CO2 21 AG 12, BUN 25  EKG EKG was reviewed independently.  Incomplete bundle branch block, no acute ischemic changes  Radiology:  All images reviewed independently. Agree with radiology report at this time.     Interpretations, interventions, and the patient's course of care are documented below.    -Labs reviewed as above, noted to be severely anemic to 4.5 which was confirmed on recheck.  Lactic acid within normal limits. Was found to be Hemoccult positive, suspect GI bleed as source of bleeding, though no obvious infectious source identified. - 6:23 PM I had an extensive conversation with patient's spouse and daughter at bedside regarding patient's hemoglobin of 4.5 and our recommendation to pursue blood transfusion.  They are declining blood transfusion at this time due to concerns over inability to screen for vaccination status of donors, namely they would only want blood from unvaccinated donors  due to concerns it has been "  tainted" and could result in numerous complications due to " all the things in the blood" as a result of vaccination.  Patient's family reports they have been antivaccine for quite some time and have never pursued blood products.  They additionally believe given patient's numerous comorbidities and significant decline in functional status since his stroke, they believe he is at end-of-life and would like to focus on patient's comfort.  To them they believe quality of life would include him at least being able to sit up and hold his grandchildren.  I discussed that it is possible for him to have improvement in his mental status and energy level with blood transfusions and other supportive care while still focusing on an overall transition towards end-of-life care, however it is impossible to tell.  They ultimately decided against blood transfusion however are still open to continuing other resuscitative measures including IV fluids, antibiotics.  Patient will be admitted to the family medicine teaching service for further management of sepsis as well as GI bleed.  Inpatient team was advised of family likely transitioning towards comfort care and request for palliative care involvement.  Disposition:  I discussed the case with family medicine teaching service who graciously agreed to admit the patient to their service for continued care.   Clinical Impression:  1. Anemia, unspecified type   2. Goals of care, counseling/discussion     Rx / DC Orders ED Discharge Orders     None       The plan for this patient was discussed with Dr. Jodi Mourning, who voiced agreement and who oversaw evaluation and treatment of this patient.   Clinical Complexity A medically appropriate history, review of systems, and physical exam was performed.  My independent interpretations of EKG, labs, and radiology are documented in the ED course above.   If decision rules were used in this patient's evaluation, they are  listed below.   Patient's presentation is most consistent with acute presentation with potential threat to life or bodily function.  Medical Decision Making Amount and/or Complexity of Data Reviewed Labs: ordered. Radiology: ordered.  Risk OTC drugs. Prescription drug management. Decision regarding hospitalization.    HPI/ROS      See MDM section for pertinent HPI and ROS. A complete ROS was performed with pertinent positives/negatives noted above.   Past Medical History:  Diagnosis Date   Arthritis    Heart murmur    pt has had an echocardiogram 06/26/21   HFrEF (heart failure with reduced ejection fraction) (HCC)    History of kidney stones    White coat syndrome with diagnosis of hypertension     Past Surgical History:  Procedure Laterality Date   ANKLE FRACTURE SURGERY Right    ANTERIOR CERVICAL DECOMP/DISCECTOMY FUSION N/A 08/15/2022   Procedure: Anterior Cervical Decompression Fusion  - Cervical four-Cervical five - Cervical five-Cervical six - Cervical six-Cervical seven;  Surgeon: Arman Bogus, MD;  Location: [facility] OR;  Service: Neurosurgery;  Laterality: N/A;   KIDNEY SURGERY Left    LUMBAR LAMINECTOMY/DECOMPRESSION MICRODISCECTOMY N/A 10/10/2022   Procedure: Laminectomy - Lumbar two-three- Lumbar three-Lumbar four;  Surgeon: Arman Bogus, MD;  Location: [facility] OR;  Service: Neurosurgery;  Laterality: N/A;   TONSILLECTOMY        Physical Exam   Vitals:   04/13/23 1440  BP: 124/77  Pulse: (!) 112  Resp: (!) 27  Temp: 99.1 F (37.3 C)  TempSrc: Oral  SpO2: 100%  Physical Exam Gen: Frail, elderly male.  Very slow to respond.  Attempts to open eyes to verbal command though is weak.  HENT: Conjunctiva clear, PERRL.dry mucous membranes with dry, cracked lips.  CV: Tachycardic rate, regular rhythm. No M/R/G Pulm: Lungs CTAB with no wheezing, rales, or rhonchi.  GI: Abdomen soft, non-tender, non-distended. Normal bowel sounds in all 4  quadrants. MSK/Skin: No lower extremity edema. Extremities warm, well-perfused with 2+ pulses in all 4 extremities.  Poor skin turgor.  Sacral wound with some surrounding erythema at site of bandage adhesive, though wound itself does not appear acutely infected.  Overlying barrier cream present. Neuro: Lethargic.  Weak grip strength bilaterally more so on the left.  Weaker plantarflexion and dorsiflexion on the left.  Sensation is intact to light touch throughout.   Procedures   If procedures were preformed on this patient, they are listed below:  Procedures   Mikeal Hawthorne, MD Emergency Medicine PGY-2   Please note that this documentation was produced with the assistance of voice-to-text technology and may contain errors.    Mikeal Hawthorne, MD 04/14/23 [phone]

## 2023-04-13 NOTE — ED Notes (Signed)
 Patient transported to CT

## 2023-04-13 NOTE — Assessment & Plan Note (Addendum)
Hemoglobin 4.5.  FOBT positive.  Diffuse abdominal rigidity, appears uncomfortable on palpation. CTAP w/ contrast showed hyperdense stool in rectosigmoid colon, slow transit.  Likely secondary to GI blood loss.  Family attests patient has had GI ulcers in the past. It is the repeated expressed wishes of the family at bedside that patient not receive blood products potentially sourced from vaccinated individuals.  Both wife and daughter are healthcare POAs.  It is their opinion that patient would agree with judgment.  They were informed that his profound anemia was the likely cause of patient's current presentation and that his current hemoglobin status presents an acute threat to the patient's life.  They acknowledge this, and their preferences remain the same.  - Admit to FMTS Med-surg bed, attending physician Dr. Linwood Dibbles  - Family amenable to IVF. S/p 1 L IV LR in the ED.  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  Will continue to monitor heart rate.  For MAPs <65, will give spot dose 500 cc boluses. - Begin IV vancomycin/cefepime per pharmacy consult to empirically cover for potential sepsis.  - f/u BCx - Consider GI consult in AM - MRSA swab

## 2023-04-13 NOTE — Hospital Course (Signed)
Daughter went to see pt today. EMS was there, doctor said he had an elevated tempterature and HR was elevated.  Reports there's no new symptoms since lkast few weeks. Although he has been having increased apnea episodes where he will stop breathing, and then gasp and wave his arms.  Family (mom and daughter) are naturalpaths. They don't want "traditional" treatments.   They would want like to focus on comfort care. They are ok with IV fluids.   He was    Worried he is on plavix.

## 2023-04-13 NOTE — Progress Notes (Signed)
Pharmacy Antibiotic Note  Marco Alvarado is a 78 y.o. male for which pharmacy has been consulted for cefepime and vancomycin dosing for sepsis.  Patient with a history of  LBBB, mild nonischemic cardiomyopathy, ACDF, lumbar fusion, dementia. Patient presenting with AMS.  SCr 1.1 WBC 16.6; LA 1.8; T 100.5; HR 110>100; RR 28>12 COVID neg / flu neg   Plan: Cefepime 2g q12hr  Vancomycin 1250 mg once then 1000 mg q24hr (eAUC 518.5) unless change in renal function Monitor WBC, fever, renal function, cultures De-escalate when able Levels at steady state  Height: 5\' 7"  (170.2 cm) Weight: 60 kg (132 lb 4.4 oz) IBW/kg (Calculated) : 66.1  Temp (24hrs), Avg:99.7 F (37.6 C), Min:99.1 F (37.3 C), Max:100.5 F (38.1 C)  Recent Labs  Lab 04/13/23 1533 04/13/23 1543 04/13/23 1631  WBC  --   --  16.6*  CREATININE 1.10  --   --   LATICACIDVEN  --  1.8  --     Estimated Creatinine Clearance: 48.5 mL/min (by C-G formula based on SCr of 1.1 mg/dL).    No Known Allergies  Microbiology results: Pending  Thank you for allowing pharmacy to be a part of this patient's care.  Delmar Landau, PharmD, BCPS 04/13/2023 9:34 PM ED Clinical Pharmacist -  [phone]

## 2023-04-13 NOTE — ED Notes (Signed)
Pt has staph infection. He received one round of abx and family was told that was enough to treat infection. Pt arrived without the infection and infection happened while at the hospital.   Family feels pt should have not left with infection.

## 2023-04-13 NOTE — ED Notes (Signed)
Pt has had increased respirations. RN placed pt on 2 L nasal canula.

## 2023-04-13 NOTE — ED Triage Notes (Signed)
Pt bib ems from [facility] c/o sepsis. Pt has had a decline in status over the past week. Pt was d/c to facility for stroke. Pt received abx but not sure if he completed course.   BP 130/76  RR 26 HR 112 EtCO2 24 CBG 329  500 cc NS bolus

## 2023-04-13 NOTE — H&P (Cosign Needed Addendum)
Hospital Admission History and Physical Service Pager: [phone]  Patient name: Marco Alvarado Medical record number: [id number] Date of Birth: 27-Dec-1945 Age: 78 y.o. Gender: male  Primary Care Provider: Theodis Shove, DO Consultants: Palliative care Code Status: DNR, no compressions known division. Preferred Emergency Contact: Wife Montel Culver ([phone])  Chief Complaint: Anemia  Assessment and Plan: Marco Alvarado is a 78 y.o. male presenting with anemia and altered mental status.  Of note, patient was previously admitted from 1/22 - 1/28 for significant stroke with significant cognitive sequela I (left basal ganglia infarct) requiring discharge to SNF [facility]) at that time. Differential for this patient's presentation of minimal responsiveness and tachycardia include:  Profound anemia with attendant hypovolemia: Hemoglobin 4.5 on admit.  Confirmed on repeat.  FOBT+.  Anuric since arrival.  Tachyrhythmia: Sinus tach on EKG. Sepsis: Patient has open sacral wound, although it appears better than before. No sign of sacral osteo per CTAP read. Leukocytosis to 16.6.  Briefly febrile to 100.5, now 99.4.  Low concern for acute infectious process.  More likely secondary to peritoneal irritation from free blood. Flu/COVID negative. UA negative Assessment & Plan Anemia, unspecified type Hemoglobin 4.5.  FOBT positive.  Diffuse abdominal rigidity, appears uncomfortable on palpation. CTAP w/ contrast showed hyperdense stool in rectosigmoid colon, slow transit.  Likely secondary to GI blood loss.  Family attests patient has had GI ulcers in the past. It is the repeated expressed wishes of the family at bedside that patient not receive blood products potentially sourced from vaccinated individuals.  Both wife and daughter are healthcare POAs.  It is their opinion that patient would agree with judgment.  They were informed that his profound anemia was the likely cause of  patient's current presentation and that his current hemoglobin status presents an acute threat to the patient's life.  They acknowledge this, and their preferences remain the same.  - Admit to FMTS Med-surg bed, attending physician Dr. Linwood Dibbles  - Family amenable to IVF. S/p 1 L IV LR in the ED.  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  Will continue to monitor heart rate.  For MAPs <65, will give spot dose 500 cc boluses. - Begin IV vancomycin/cefepime per pharmacy consult to empirically cover for potential sepsis.  - f/u BCx - Consider GI consult in AM - MRSA swab Goals of care, counseling/discussion Patient is now DNR/DNI.  Per family, no CPR/invasive measures. They would NOT want pressors, ICU care, or other invasive measures such as colonoscopy, endoscopy, surgery. They are agreeable to continuing antibiotics for now. Family would like defer switching to full comfort care measures until discussion with palliative care team tomorrow.  It appears daughter and wife have anticipated this situation after significant stroke 03/2023. Have been counseled that patient could pass overnight and they expressed understanding. - Palliative care consult placed, appreciate recs  - Family is agreeable to low dose morphine for air hunger/discomfort and IV antibiotics for sepsis criteria in the hope that this may help the patient's discomfort.  - Start PRN IV morphine 2 mg every 4 hours for moderate pain.  Family advised that narcotic medications may suppress patient's respiratory drive and increases overall risk, acknowledged.  Chronic and Stable Conditions:  Dementia: Worsened over the last year. CT Head read: "age-related cerebral atrophy with severe chronic microvascular ischemic disease" Sacral wound: Per wife, appears stabilized/unchanged. 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 male with a past medical history of recent left basal ganglia infarct requiring SNF care, dementia, HFmrEF, sacral wound, NICM on home Entresto presenting with continued altered mental status and tachycardia.  On interview with daughter and wife in room, patient opens eyes, occasionally apneic, responds to abdominal palpation and less often to voice. Safety mittens placed. He is unable to participate in interview. Appears to be in intermittent pain.  Daughter went to see patient today at [facility] -- EMS was present, physician on-site noted patient had slightly elevated temperature and heart rate. Patient mental status appears unchanged since discharge from [facility] last month. Patient had difficulties moving around previously, but could participate at Christmas gathering. After stroke in 03/2023, patient has not been able to speak coherently. Recognizes wife, but few others.  It is the opinion of both daughter and wife (both of whom are HCPOAs) that patient mental status has declined sharply following his basal ganglia stroke, in the setting of more progressive global decline over the past year.  They note that patient has had increased apneic episodes since the stroke, that he would "gasp" wife's arms.  They believe he is likely approaching the end of his life and that he would not want the following wife preserving measures: Chest compressions, intubation, blood transfusion, ICU level care, or invasive procedures.  Patient, wife and daughter are all naturopaths.  Wife and daughter were counseled that, in the setting of positive fetal occult blood, distended abdomen tender to palpation, and hemoglobin of 4.5, that presentation of altered mental status and tachycardia was likely primarily due to acute blood loss due to GI bleed. They were counseled that patient status could very  likely worsen (likely presenting a threat to his life) if this was not repleted.  They acknowledged understanding, and reaffirmed refusal of blood products.  Family desires palliative care conversation tomorrow 2/4.  Amenable to low-dose morphine for comfort, fluids as needed, and IV antibiotics for sepsis criteria.  In the ED, patient had isolated temperature of 100.5, returned to 19.4.  Tachycardic from 108 - 122, tachypneic from 23-30. Normotensive.  Received 1 L IV LR bolus.  Received Tylenol 650 mg suppository x 1 and IV morphine 2 mg x1 for comfort.  Review Of Systems: Per HPI with the following additions: None available.  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   Pertinent Past Surgical History:  Spinal surgery 10/2022 Nephrectomy   Remainder reviewed in history tab.  Pertinent Social History: Tobacco use: None Alcohol use: No Other Substance use: No Lives with: SNF, [facility]  Pertinent Family History: None  Remainder reviewed in history tab.   Important Outpatient Medications: Entresto Remainder reviewed in medication history.   Objective: BP 132/70   Pulse (!) 108   Temp 99.4 F (37.4 C) (Rectal)   Resp (!) 24   Ht 5\' 7"  (1.702 m)   Wt 60 kg   SpO2 100%   BMI 20.72 kg/m  Exam: General: some verbalization to discomfort, no expressive language, in safety mitts, appears chronically ill Eyes: Opens spontaneously Cardiovascular: high-normal on tele, occasionally tachy Respiratory: occasional apneic episodes Gastrointestinal: abdomen diffusely rigid to palpation, unable to perform full assessment but appears patient discomforted by palpation broadly MSK: Gross weakness in bilateral upper  and lower limbs Derm: sacral ulcer present, unable to visualize full extent of wound, picture taken, in media tab Neuro: incapable of participating in neurological exam, visual  fields appear grossly intact Psych: unable to assess   Labs:  CBC BMET  Recent Labs  Lab 04/13/23 1631  WBC 16.6*  HGB 4.5*  HCT 14.3*  PLT 341   Recent Labs  Lab 04/13/23 1533  NA 144  K 3.9  CL 111  CO2 21*  BUN 25*  CREATININE 1.10  GLUCOSE 114*  CALCIUM 8.4*    Pertinent additional labs: Fecal occult blood positive.  EKG: My own interpretation (not copied from electronic read): Sinus tachycardia, extensive artifacting   Imaging Studies Performed:   CXR (2/3)  IMPRESSION: No acute cardiopulmonary disease.   CT Head (2/3)   IMPRESSION: 1. No acute intracranial abnormality. 2. Age-related cerebral atrophy with severe chronic microvascular ischemic disease.  CTAP w/ contrast (2/3)  IMPRESSION: 1. Moderate to large hiatal hernia. 2. Chronic atrophy of the left kidney with multiple stones but no sign of hydroureteronephrosis, passing stone or inflammatory change on the left. Mild compensatory enlargement of the right kidney. 3. Enlarged prostate. 4. Hyperdense stool in the rectosigmoid region secondary to previous ingested hyperdense material. This could indicate slow transit. Mild edematous change in the fat around the rectum could indicate stercoral colitis. 5. Possible pressure ulcerations overlying the sacrum but no sign of sacral or pelvic osteomyelitis.  Tomie China, MD 04/13/2023, 7:32 PM PGY-1, [facility]  FPTS Intern pager: [phone], text pages welcome Secure chat group [facility] Teaching Service

## 2023-04-14 DIAGNOSIS — D62 Acute posthemorrhagic anemia: Secondary | ICD-10-CM | POA: Diagnosis present

## 2023-04-14 DIAGNOSIS — K922 Gastrointestinal hemorrhage, unspecified: Secondary | ICD-10-CM | POA: Diagnosis present

## 2023-04-14 DIAGNOSIS — Z515 Encounter for palliative care: Secondary | ICD-10-CM | POA: Diagnosis not present

## 2023-04-14 DIAGNOSIS — Z7189 Other specified counseling: Secondary | ICD-10-CM

## 2023-04-14 DIAGNOSIS — Z1152 Encounter for screening for COVID-19: Secondary | ICD-10-CM | POA: Diagnosis not present

## 2023-04-14 DIAGNOSIS — M4712 Other spondylosis with myelopathy, cervical region: Secondary | ICD-10-CM | POA: Diagnosis present

## 2023-04-14 DIAGNOSIS — R627 Adult failure to thrive: Secondary | ICD-10-CM

## 2023-04-14 DIAGNOSIS — Z66 Do not resuscitate: Secondary | ICD-10-CM | POA: Diagnosis present

## 2023-04-14 DIAGNOSIS — Z8673 Personal history of transient ischemic attack (TIA), and cerebral infarction without residual deficits: Secondary | ICD-10-CM | POA: Diagnosis not present

## 2023-04-14 DIAGNOSIS — I428 Other cardiomyopathies: Secondary | ICD-10-CM | POA: Diagnosis present

## 2023-04-14 DIAGNOSIS — Z981 Arthrodesis status: Secondary | ICD-10-CM | POA: Diagnosis not present

## 2023-04-14 DIAGNOSIS — L89116 Pressure-induced deep tissue damage of right upper back: Secondary | ICD-10-CM | POA: Diagnosis present

## 2023-04-14 DIAGNOSIS — L89316 Pressure-induced deep tissue damage of right buttock: Secondary | ICD-10-CM | POA: Diagnosis present

## 2023-04-14 DIAGNOSIS — D649 Anemia, unspecified: Secondary | ICD-10-CM | POA: Diagnosis present

## 2023-04-14 DIAGNOSIS — Z531 Procedure and treatment not carried out because of patient's decision for reasons of belief and group pressure: Secondary | ICD-10-CM | POA: Diagnosis present

## 2023-04-14 DIAGNOSIS — R64 Cachexia: Secondary | ICD-10-CM | POA: Diagnosis present

## 2023-04-14 DIAGNOSIS — Z6820 Body mass index (BMI) 20.0-20.9, adult: Secondary | ICD-10-CM | POA: Diagnosis not present

## 2023-04-14 DIAGNOSIS — I5022 Chronic systolic (congestive) heart failure: Secondary | ICD-10-CM | POA: Diagnosis present

## 2023-04-14 DIAGNOSIS — F0390 Unspecified dementia without behavioral disturbance: Secondary | ICD-10-CM | POA: Diagnosis present

## 2023-04-14 DIAGNOSIS — Z905 Acquired absence of kidney: Secondary | ICD-10-CM | POA: Diagnosis not present

## 2023-04-14 DIAGNOSIS — G319 Degenerative disease of nervous system, unspecified: Secondary | ICD-10-CM | POA: Diagnosis present

## 2023-04-14 DIAGNOSIS — M1990 Unspecified osteoarthritis, unspecified site: Secondary | ICD-10-CM | POA: Diagnosis present

## 2023-04-14 DIAGNOSIS — I4510 Unspecified right bundle-branch block: Secondary | ICD-10-CM | POA: Diagnosis present

## 2023-04-14 DIAGNOSIS — L89326 Pressure-induced deep tissue damage of left buttock: Secondary | ICD-10-CM | POA: Diagnosis present

## 2023-04-14 DIAGNOSIS — E861 Hypovolemia: Secondary | ICD-10-CM | POA: Diagnosis present

## 2023-04-14 DIAGNOSIS — Z8711 Personal history of peptic ulcer disease: Secondary | ICD-10-CM | POA: Diagnosis not present

## 2023-04-14 MED ORDER — HALOPERIDOL 1 MG PO TABS: 0.5000 mg | ORAL_TABLET | ORAL | Status: DC | PRN

## 2023-04-14 MED ORDER — MORPHINE SULFATE (PF) 2 MG/ML IV SOLN
2.0000 mg | INTRAVENOUS | Status: DC | PRN
  Filled 2023-04-14: qty 1

## 2023-04-14 MED ORDER — LORAZEPAM 2 MG/ML PO CONC: 1.0000 mg | ORAL | Status: DC | PRN

## 2023-04-14 MED ORDER — HALOPERIDOL LACTATE 5 MG/ML IJ SOLN: 0.5000 mg | INTRAMUSCULAR | Status: DC | PRN

## 2023-04-14 MED ORDER — GLYCOPYRROLATE 1 MG PO TABS: 1.0000 mg | ORAL_TABLET | ORAL | Status: DC | PRN

## 2023-04-14 MED ORDER — ONDANSETRON 4 MG PO TBDP: 4.0000 mg | ORAL_TABLET | Freq: Four times a day (QID) | ORAL | Status: DC | PRN

## 2023-04-14 MED ORDER — POLYVINYL ALCOHOL 1.4 % OP SOLN: 1.0000 [drp] | Freq: Four times a day (QID) | OPHTHALMIC | Status: DC | PRN

## 2023-04-14 MED ORDER — GLYCOPYRROLATE 0.2 MG/ML IJ SOLN: 0.2000 mg | INTRAMUSCULAR | Status: DC | PRN

## 2023-04-14 MED ORDER — HALOPERIDOL LACTATE 2 MG/ML PO CONC: 0.5000 mg | ORAL | Status: DC | PRN

## 2023-04-14 MED ORDER — ONDANSETRON HCL 4 MG/2ML IJ SOLN: 4.0000 mg | Freq: Four times a day (QID) | INTRAMUSCULAR | Status: DC | PRN

## 2023-04-14 MED ORDER — MORPHINE SULFATE (CONCENTRATE) 10 MG /0.5 ML PO SOLN: 5.0000 mg | ORAL | Status: DC | PRN

## 2023-04-14 MED ORDER — MORPHINE SULFATE (PF) 2 MG/ML IV SOLN
2.0000 mg | INTRAVENOUS | Status: DC
  Administered 2023-04-14 – 2023-04-15 (×5): 2 mg via INTRAVENOUS
  Filled 2023-04-14 (×4): qty 1

## 2023-04-14 MED ORDER — BIOTENE DRY MOUTH MT LIQD: 15.0000 mL | OROMUCOSAL | Status: DC | PRN

## 2023-04-14 MED ORDER — LORAZEPAM 1 MG PO TABS: 1.0000 mg | ORAL_TABLET | ORAL | Status: DC | PRN

## 2023-04-14 MED ORDER — LORAZEPAM 2 MG/ML IJ SOLN
1.0000 mg | INTRAMUSCULAR | Status: DC | PRN
  Administered 2023-04-14 – 2023-04-15 (×3): 1 mg via INTRAVENOUS
  Filled 2023-04-14 (×3): qty 1

## 2023-04-14 NOTE — Assessment & Plan Note (Signed)
 Discussed with patient's wife and daughter the prognosis of the patient without intervention for his critically low hemoglobin.  They were able to share insight as to his level of functioning after being discharged from the hospital the last time.  They feel that he would not want any further interventions and would like to proceed with comfort care.  Patient was a DNR comfort and hospice orders were placed.  Currently he is not stable enough to move so he will remain here on the hospital until he passes.

## 2023-04-14 NOTE — Progress Notes (Signed)
     Daily Progress Note Intern Pager: [phone]  Patient name: Loris Winrow Medical record number: [id number] Date of birth: April 09, 1945 Age: 78 y.o. Gender: male  Primary Care Provider: Judyth Isaiah Bottcher, DO Consultants: Palliative Code Status: DNR  Pt Overview and Major Events to Date:  2/3- admitted  Assessment and Plan:  [name] Duty is a 78 year old man presenting with significant anemia suspected to have a GI bleed.  He has a significant past medical history of prior stroke with decreased baseline function.  His daughter and wife are at bedside and have elected to move him to comfort care at this time. Assessment & Plan Anemia No further interventions are desired Goals of care, counseling/discussion Discussed with patient's wife and daughter the prognosis of the patient without intervention for his critically low hemoglobin.  They were able to share insight as to his level of functioning after being discharged from the hospital the last time.  They feel that he would not want any further interventions and would like to proceed with comfort care.  Patient was a DNR comfort and hospice orders were placed.  Currently he is not stable enough to move so he will remain here on the hospital until he passes.  FEN/GI: Regular as tolerated PPx: None patient is comfort care Dispo: Inpatient hospice  Subjective:  Patient is somnolent and minimally responsive on exam this morning.  Objective: Temp:  [98 F (36.7 C)-100.5 F (38.1 C)] 98 F (36.7 C) (02/04 0520) Pulse Rate:  [99-122] 105 (02/04 0520) Resp:  [12-30] 20 (02/04 0520) BP: (119-137)/(62-91) 122/69 (02/04 0520) SpO2:  [100 %] 100 % (02/04 0520) Weight:  [60 kg] 60 kg (02/03 1502) Physical Exam: General: Elderly appearing gentleman in no distress Cardiovascular: RRR, no M/R/G Respiratory: Agonal breathing Abdomen: Flat, soft, nontender Extremities: Warm, well-perfused.  Laboratory: Most recent CBC Lab  Results  Component Value Date   WBC 16.6 (H) 04/13/2023   HGB 4.5 (LL) 04/13/2023   HCT 14.3 (L) 04/13/2023   MCV 100.7 (H) 04/13/2023   PLT 341 04/13/2023   Most recent BMP    Latest Ref Rng & Units 04/13/2023    3:33 PM  BMP  Glucose 70 - 99 mg/dL 885   BUN 8 - 23 mg/dL 25   Creatinine 9.38 - 1.24 mg/dL 8.89   Sodium 864 - 854 mmol/L 144   Potassium 3.5 - 5.1 mmol/L 3.9   Chloride 98 - 111 mmol/L 111   CO2 22 - 32 mmol/L 21   Calcium  8.9 - 10.3 mg/dL 8.4    Cleotilde Lukes, DO 04/14/2023, 9:25 AM  PGY-1, [facility] FPTS Intern pager: [phone], text pages welcome Secure chat group [facility] Teaching Service

## 2023-04-14 NOTE — Assessment & Plan Note (Signed)
No further interventions are desired

## 2023-04-14 NOTE — Plan of Care (Signed)
  Problem: Pain Management: Goal: Satisfaction with pain management regimen will improve Outcome: Progressing   Problem: Role Relationship: Goal: Ability to verbalize concerns, feelings, and thoughts to partner or family member will improve Outcome: Progressing   Problem: Role Relationship: Goal: Family's ability to cope with current situation will improve Outcome: Progressing   Problem: Respiratory: Goal: Verbalizations of increased ease of respirations will increase Outcome: Progressing

## 2023-04-14 NOTE — Progress Notes (Signed)
   04/14/23 0630  Spiritual Encounters  Type of Visit Initial  Care provided to: Family  Referral source Nurse (RN/NT/LPN)  Reason for visit Urgent spiritual support  OnCall Visit No   Chaplain responded to a request for support made by the patient, Ted's family.  The family expects to meet with palliative care today to consider next steps of transition for them. We prayed for love strength and wisdom as they navigate their journey with [name].   Carley [place] [facility]  [phone]

## 2023-04-14 NOTE — Assessment & Plan Note (Signed)
 Patient is now DNR/DNI.  Per family, no CPR/invasive measures. They would NOT want pressors, ICU care, or other invasive measures such as colonoscopy, endoscopy, surgery. They are agreeable to continuing antibiotics for now. Family would like defer switching to full comfort care measures until discussion with palliative care team tomorrow.  It appears daughter and wife have anticipated this situation after significant stroke 03/2023. Have been counseled that patient could pass overnight and they expressed understanding. - Palliative care consult placed, appreciate recs  - Family is agreeable to low dose morphine  for air hunger/discomfort and IV antibiotics for sepsis criteria in the hope that this may help the patient's discomfort.  - Start PRN IV morphine  2 mg every 4 hours for moderate pain.  Family advised that narcotic medications may suppress patient's respiratory drive and increases overall risk, acknowledged.

## 2023-04-14 NOTE — TOC CM/SW Note (Signed)
 Transition of Care [facility]) - Inpatient Brief Assessment   Patient Details  Name: Marco Alvarado MRN: [id number] Date of Birth: 09/07/1945  Transition of Care [facility]) CM/SW Contact:    Tom-Johnson, Harvest Muskrat, RN Phone Number: 04/14/2023, 4:41 PM   Clinical Narrative:  Patient presented to the ED from [facility] with Altered Mental Status, found to be Anemic with Hgb at 4.5. Per notes, daughter declined Blood transfusion, requesting Comfort Care. Patient transitioned to Full Comfort Care at this time, Palliative following with GOC.   CM will continue to follow and render compassionate support at this transitioning time.          Transition of Care Asessment:   Patient has primary care physician: Yes Home environment has been reviewed: [facility] Prior level of function:: Dependent Prior/Current Home Services: Current home services (SNF) Social Drivers of Health Review: SDOH reviewed no interventions necessary Readmission risk has been reviewed: Yes Transition of care needs: transition of care needs identified, TOC will continue to follow

## 2023-04-14 NOTE — Consult Note (Signed)
 Consultation Note Date: 04/14/2023   Patient Name: Marco Alvarado  DOB: 28-Jan-1946  MRN: [id number]  Age / Sex: 78 y.o., male  PCP: Combs, Isaiah Bottcher, DO Referring Physician: Madelon Donald HERO, DO  Reason for Consultation:  symptom management, end of life discussion for 78yo M w/ likely GI bleed. Daughter and wife would not like blood transfusion and want to discuss with Palliative about transitioning to comfort care  HPI/Patient Profile: 78 y.o. male  with past medical history of chronic renal atrophy, R basal ganglia stroke, HF, cervical and spinal fusion, recent admission for fall at home and was down for several days- MRI indicated stroke during that admission- he was discharged to [facility]- now admitted on 04/13/2023 with altered mental status. Workup reveals hemoglobin 4.5. Hemoccult positive- GI bleed presumed to be cause of anemia. Palliative medicine consulted for goals of care.    Primary Decision Maker NEXT OF KIN - spouse and daughter  Discussion: Chart reviewed including labs, progress notes, imaging from this and previous encounters.  Evaluated patient- he was sleeping, at first did not arouse to my voice or light touch. He appeared frail, cachectic.   Met with patient's wife and daughter in conference room.  They shared that Marco Alvarado has had significant overall decline in the last year.  Reviewed that Marco Alvarado does not value medical interventions at this point in his life. He has poor quality of life with multiple organs failing. He would not want any medical interventions to prolong his suffering.  He has consistently refused medical care in the past. He declined PT and OT after his surgeries.  Their hope is that Marco Alvarado has a peaceful, humane end of life.  We discussed transition to comfort measures only. Discussed transition to comfort measures only which includes stopping IV fluids, antibiotics,  labs and providing symptom management for SOB, anxiety, nausea, vomiting, and other symptoms of dying.  Possible hospice services discussed. Family would like patient to remain in the hospital for comfort care.      SUMMARY OF RECOMMENDATIONS -Transition to full comfort measures only -Morphine  IV 2mg  q 4hrs for comfort -Morphine  IV 2mg  q15 min as needed for any distress, dyspnea -D/C all medications, IV fluids, labs, other interventions not contributing to comfort -Lorazepam  1mg  IV q4hr prn agitation or anxiety -Other comfort interventions as ordered -Letter provided per family request stating patient is hospitalized with critical illness and not likely to survive hospitalization     Code Status/Advance Care Planning: DNR   Prognosis:   < 2 weeks  Discharge Planning: Anticipated Hospital Death  Primary Diagnoses: Present on Admission:  Anemia   Review of Systems  Unable to perform ROS   Physical Exam Vitals and nursing note reviewed.  Constitutional:      Appearance: He is ill-appearing.     Comments: cachetic  Pulmonary:     Effort: Pulmonary effort is normal.  Skin:    Coloration: Skin is pale.     Comments: ashen  Neurological:     Comments: Somnolent,  no verbal responses     Vital Signs: BP 122/69 (BP Location: Left Arm)   Pulse (!) 105   Temp 98 F (36.7 C) (Axillary)   Resp 20   Ht 5' 7 (1.702 m)   Wt 60 kg   SpO2 100%   BMI 20.72 kg/m  Pain Scale: PAINAD   Pain Score: 0-No pain   IO: Intake/output summary:  Intake/Output Summary (Last 24 hours) at 04/14/2023 1111 Last data filed at 04/14/2023 0900 Gross per 24 hour  Intake 1100 ml  Output 0 ml  Net 1100 ml    LBM:   Baseline Weight: Weight: 60 kg Most recent weight: Weight: 60 kg       Thank you for this consult. Palliative medicine will continue to follow and assist as needed.  Time Total: 120 minutes Signed by: Cassondra Stain, AGNP-C Palliative Medicine  Time includes:   Preparing  to see the patient (e.g., review of tests) Obtaining and/or reviewing separately obtained history Performing a medically necessary appropriate examination and/or evaluation Counseling and educating the patient/family/caregiver Ordering medications, tests, or procedures Referring and communicating with other health care professionals (when not reported separately) Documenting clinical information in the electronic or other health record Independently interpreting results (not reported separately) and communicating results to the patient/family/caregiver Care coordination (not reported separately) Clinical documentation   Please contact Palliative Medicine Team phone at [phone] for questions and concerns.  For individual provider: See Tracey

## 2023-04-15 DIAGNOSIS — Z515 Encounter for palliative care: Secondary | ICD-10-CM

## 2023-04-15 DIAGNOSIS — Z7189 Other specified counseling: Secondary | ICD-10-CM | POA: Diagnosis not present

## 2023-04-15 DIAGNOSIS — D62 Acute posthemorrhagic anemia: Secondary | ICD-10-CM | POA: Diagnosis not present

## 2023-04-15 DIAGNOSIS — K922 Gastrointestinal hemorrhage, unspecified: Secondary | ICD-10-CM

## 2023-04-15 DIAGNOSIS — R627 Adult failure to thrive: Secondary | ICD-10-CM | POA: Diagnosis not present

## 2023-04-15 MED ORDER — MORPHINE SULFATE (PF) 4 MG/ML IV SOLN
4.0000 mg | INTRAVENOUS | Status: AC
  Administered 2023-04-15: 4 mg via INTRAVENOUS
  Filled 2023-04-15: qty 1

## 2023-04-15 MED ORDER — MORPHINE SULFATE (PF) 4 MG/ML IV SOLN
4.0000 mg | INTRAVENOUS | Status: DC
  Administered 2023-04-15: 4 mg via INTRAVENOUS
  Filled 2023-04-15: qty 1

## 2023-04-15 MED ORDER — LORAZEPAM 2 MG/ML IJ SOLN
2.0000 mg | Freq: Four times a day (QID) | INTRAMUSCULAR | Status: DC
  Administered 2023-04-15: 2 mg via INTRAVENOUS
  Filled 2023-04-15: qty 1

## 2023-04-15 MED ORDER — MORPHINE SULFATE (PF) 4 MG/ML IV SOLN: 4.0000 mg | INTRAVENOUS | Status: DC | PRN

## 2023-04-18 LAB — CULTURE, BLOOD (ROUTINE X 2)
Culture: NO GROWTH
Culture: NO GROWTH
Special Requests: ADEQUATE

## 2023-05-09 NOTE — Assessment & Plan Note (Addendum)
 Patient remains comfortable, comfort measures only

## 2023-05-09 NOTE — Progress Notes (Signed)
 Daily Progress Note   Patient Name: Marco Alvarado       Date: April 25, 2023 DOB: October 02, 1945  Age: 78 y.o. MRN#: [id number] Attending Physician: Delores Suzann HERO, MD Primary Care Physician: Judyth Isaiah Bottcher, DO Admit Date: 04/13/2023  Reason for Consultation/Follow-up: Establishing goals of care and Terminal Care  Patient Profile/HPI:   78 y.o. male  with past medical history of chronic renal atrophy, R basal ganglia stroke, HF, cervical and spinal fusion, recent admission for fall at home and was down for several days- MRI indicated stroke during that admission- he was discharged to [facility]- now admitted on 04/13/2023 with altered mental status. Workup reveals hemoglobin 4.5. Hemoccult positive- GI bleed presumed to be cause of anemia. Palliative medicine consulted for goals of care.     Subjective: Patient unresponsive to my voice or touch. Tachycardic. RR rate increased and moaning at times.  No family at bedside.   Review of Systems  Unable to perform ROS: Acuity of condition     Physical Exam Vitals and nursing note reviewed.             Vital Signs: BP 96/65 (BP Location: Left Arm)   Pulse (!) 150   Temp 100.3 F (37.9 C) (Oral)   Resp (!) 42   Ht 5' 7 (1.702 m)   Wt 60 kg   SpO2 (!) 87%   BMI 20.72 kg/m  SpO2: SpO2: (!) 87 % O2 Device: O2 Device: Room Air O2 Flow Rate: O2 Flow Rate (L/min): 3 L/min  Intake/output summary:  Intake/Output Summary (Last 24 hours) at Apr 25, 2023 1029 Last data filed at 2023/04/25 1013 Gross per 24 hour  Intake 0 ml  Output 1400 ml  Net -1400 ml   LBM: Last BM Date :  (UTA) Baseline Weight: Weight: 60 kg Most recent weight: Weight: 60 kg       Palliative Assessment/Data: PPS: 10%      Patient Active Problem List    Diagnosis Date Noted  . Hospice care Apr 25, 2023  . Goals of care, counseling/discussion 04/14/2023  . Fall on same level 04/03/2023  . Fall  Infarction of right basal ganglia (HCC) 04/01/2023  . Memory loss 04/01/2023  . Elevated serum creatinine  Hypernatremia 04/01/2023  . Pressure ulcer 04/01/2023  . Postoperative pain after spinal surgery 10/13/2022  .  S/P lumbar fusion 10/10/2022  . 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    Palliative Care Assessment & Plan    Assessment/Recommendations/Plan  Increase morphine  to 4mg  IV for scheduled and prn doses Start scheduled lorazepam  2mg  IV every 6 hours   Code Status: DNR  Prognosis:  < 2 weeks  Discharge Planning: To Be Determined   Thank you for allowing the Palliative Medicine Team to assist in the care of this patient. :  Time includes:   Preparing to see the patient (e.g., review of tests) Obtaining and/or reviewing separately obtained history Performing a medically necessary appropriate examination and/or evaluation Counseling and educating the patient/family/caregiver Ordering medications, tests, or procedures Referring and communicating with other health care professionals (when not reported separately) Documenting clinical information in the electronic or other health record Independently interpreting results (not reported separately) and communicating results to the patient/family/caregiver Care coordination (not reported separately) Clinical documentation  Cassondra Stain, AGNP-C Palliative Medicine   Please contact Palliative Medicine Team phone at [phone] for questions and concerns.

## 2023-05-09 NOTE — Assessment & Plan Note (Addendum)
No further interventions are desired

## 2023-05-09 NOTE — Progress Notes (Addendum)
     Daily Progress Note Intern Pager: [phone]  Patient name: Marco Alvarado Medical record number: [id number] Date of birth: 02-12-1946 Age: 78 y.o. Gender: male  Primary Care Provider: Judyth Isaiah Bottcher, DO Consultants: Palliative Code Status: DNR-DNI  Pt Overview and Major Events to Date:  2/3-admitted  Assessment and Plan:  78 yo male admitted for anemia presumed to be due to GI bleed, family transitioned to comfort measures on admission 04/14/23.  Assessment & Plan Anemia (Resolved: May 02, 2023) No further interventions are desired Goals of care, counseling/discussion Discussed with patient's wife and daughter the prognosis of the patient without intervention for his critically low hemoglobin.  They were able to share insight as to his level of functioning after being discharged from the hospital the last time.  They feel that he would not want any further interventions and would like to proceed with comfort care.  Patient was a DNR comfort and hospice orders were placed.  Currently he is not stable enough to move so he will remain here on the hospital until he passes. Hospice care Patient remains comfortable, comfort measures only  FEN/GI: Regular PPx: None Dispo:anticipate in hospital death   Subjective:  Somnolent, eyes closed.  Objective: Temp:  [100.3 F (37.9 C)] 100.3 F (37.9 C) 05-02-23 0602) Pulse Rate:  [137] 137 2023-05-02 0602) Resp:  [20] 20 May 02, 2023 0602) BP: (97)/(65) 97/65 May 02, 2023 0602) SpO2:  [88 %] 88 % 05/02/2023 0602) Physical Exam: General: Elderly appearing gentleman, no distress Cardiovascular: Irregularly irregular. No m/r/g Respiratory: Agonal Abdomen: Flat, soft.  Extremities: Warm, well perfused  Laboratory: Most recent CBC Lab Results  Component Value Date   WBC 16.6 (H) 04/13/2023   HGB 4.5 (LL) 04/13/2023   HCT 14.3 (L) 04/13/2023   MCV 100.7 (H) 04/13/2023   PLT 341 04/13/2023   Most recent BMP    Latest Ref Rng & Units  04/13/2023    3:33 PM  BMP  Glucose 70 - 99 mg/dL 885   BUN 8 - 23 mg/dL 25   Creatinine 9.38 - 1.24 mg/dL 8.89   Sodium 864 - 854 mmol/L 144   Potassium 3.5 - 5.1 mmol/L 3.9   Chloride 98 - 111 mmol/L 111   CO2 22 - 32 mmol/L 21   Calcium  8.9 - 10.3 mg/dL 8.4    Cleotilde Lukes, DO 02-May-2023, 7:57 AM  PGY-1, [facility] FPTS Intern pager: [phone], text pages welcome Secure chat group [facility] Teaching Service

## 2023-05-09 NOTE — Death Summary Note (Signed)
   Family Medicine Teaching Service [facility] Death Summary  Patient name: Marco Alvarado Medical record number: [id number] Date of birth: 12/22/1945 Age: 78 y.o. Gender: male Date of Admission: April 20, 2023  Date and Time of Death: 2023/04/22 14:33 Admitting Physician: Suzann CHRISTELLA Daring, MD  Primary Care Provider: Judyth Isaiah Bottcher, DO Consultants: Palliative  Indication for Hospitalization: Profound anemia, likely secondary to GI losses  Discharge Diagnoses/Problem List:  Principal Problem for Admission: End of Life Care Other Problems addressed during stay:  Active Problems:   Goals of care, counseling/discussion   Hospice care  Brief Hospital Course:  Marco Alvarado is a 78 y.o. male who was admitted to the [facility] Medicine Teaching Service at [facility] for end of life care. Hospital course is outlined below by problem.   Patient initially presented to the hospital from [facility] via EMS with a physician onsite had noted elevated temperature and heart rate.  On arrival it was noted that his hemoglobin was 4.5 fecal occult blood test was positive.  Given his distended and tender abdomen, the source of his bleed was likely an acute GI bleed, however patient's daughter and wife, who are joint healthcare powers of attorney, stated that the patient would not wish to receive a blood transfusion as they are naturopaths and do not believe in receiving vaccinated blood products.  Goals of care discussion was had with wife and daughter at bedside who felt he was approaching end-of-life and opted for palliative consult and transition to comfort care.  Patient received comfort measures over the course of his stay, and was allowed to pass in the hospital.  Time of death 14:33, Apr 22, 2023.  Discharge Exam:  Vitals:   2023-04-22 0914 April 22, 2023 1014  BP: 96/65 92/61  Pulse: (!) 150 (!) 140  Resp: (!) 42 (!) 38  Temp:  99.8 F (37.7 C)  SpO2: (!) 87% (!) 89%   Significant Procedures:  None  Significant Labs and Imaging:  Recent Labs  Lab 2023/04/20 1631  WBC 16.6*  HGB 4.5*  HCT 14.3*  PLT 341   Recent Labs  Lab 2023/04/20 1533  NA 144  K 3.9  CL 111  CO2 21*  GLUCOSE 114*  BUN 25*  CREATININE 1.10  CALCIUM  8.4*  ALKPHOS 57  AST 32  ALT 24  ALBUMIN 2.3DEWAINE Pinal, Lucie, DO 2023-04-22, 2:48 PM PGY-1, [facility]

## 2023-05-09 NOTE — Assessment & Plan Note (Addendum)
 Discussed with patient's wife and daughter the prognosis of the patient without intervention for his critically low hemoglobin.  They were able to share insight as to his level of functioning after being discharged from the hospital the last time.  They feel that he would not want any further interventions and would like to proceed with comfort care.  Patient was a DNR comfort and hospice orders were placed.  Currently he is not stable enough to move so he will remain here on the hospital until he passes.

## 2023-05-09 DEATH — deceased

## 2023-05-25 ENCOUNTER — Ambulatory Visit: Payer: Medicare Other | Admitting: Neurology
